# Patient Record
Sex: Female | Born: 1980
Health system: Southern US, Community
[De-identification: ages and names within clinical notes are randomized; demographics above are authoritative.]

## PROBLEM LIST (undated history)

## (undated) DIAGNOSIS — B019 Varicella without complication: Secondary | ICD-10-CM

## (undated) DIAGNOSIS — F32A Depression, unspecified: Secondary | ICD-10-CM

## (undated) DIAGNOSIS — F329 Major depressive disorder, single episode, unspecified: Secondary | ICD-10-CM

## (undated) HISTORY — DX: Varicella without complication: B01.9

## (undated) HISTORY — DX: Depression, unspecified: F32.A

## (undated) HISTORY — DX: Major depressive disorder, single episode, unspecified: F32.9

---

## 2005-07-17 HISTORY — PX: ANKLE ARTHROSCOPY WITH OPEN REDUCTION INTERNAL FIXATION (ORIF): SHX5582

## 2013-05-27 ENCOUNTER — Emergency Department: Payer: Self-pay | Admitting: Emergency Medicine

## 2013-05-27 LAB — CBC WITH DIFFERENTIAL/PLATELET
Basophil #: 0 10*3/uL (ref 0.0–0.1)
Basophil %: 0.6 %
Eosinophil #: 0.1 10*3/uL (ref 0.0–0.7)
HCT: 41 % (ref 35.0–47.0)
HGB: 14 g/dL (ref 12.0–16.0)
Lymphocyte #: 1.7 10*3/uL (ref 1.0–3.6)
Lymphocyte %: 24.3 %
MCH: 29.8 pg (ref 26.0–34.0)
MCV: 87 fL (ref 80–100)
Neutrophil #: 4.6 10*3/uL (ref 1.4–6.5)
Platelet: 327 10*3/uL (ref 150–440)
RDW: 13.4 % (ref 11.5–14.5)
WBC: 7 10*3/uL (ref 3.6–11.0)

## 2015-05-23 ENCOUNTER — Ambulatory Visit (INDEPENDENT_AMBULATORY_CARE_PROVIDER_SITE_OTHER): Payer: 59 | Admitting: Family Medicine

## 2015-05-23 ENCOUNTER — Encounter: Payer: Self-pay | Admitting: Family Medicine

## 2015-05-23 VITALS — BP 140/78 | HR 104 | Temp 98.7°F | Wt 269.0 lb

## 2015-05-23 DIAGNOSIS — J209 Acute bronchitis, unspecified: Secondary | ICD-10-CM

## 2015-05-23 MED ORDER — BENZONATATE 200 MG PO CAPS
200.0000 mg | ORAL_CAPSULE | Freq: Three times a day (TID) | ORAL | Status: DC | PRN
Start: 2015-05-23 — End: 2015-05-23

## 2015-05-23 MED ORDER — BENZONATATE 200 MG PO CAPS: 200.0000 mg | ORAL_CAPSULE | Freq: Three times a day (TID) | ORAL | Status: DC | PRN

## 2015-05-23 MED ORDER — AZITHROMYCIN 250 MG PO TABS
ORAL_TABLET | ORAL | Status: DC
Start: 2015-05-23 — End: 2015-07-04

## 2015-05-23 MED ORDER — AZITHROMYCIN 250 MG PO TABS: ORAL_TABLET | ORAL | Status: DC

## 2015-05-23 NOTE — Progress Notes (Signed)
Patient ID: Bridget Hull, female   DOB: [birth year], 34 y.o.   MRN: [id number]  Marikay Alar, MD Phone: [phone]  Bridget Hull is a 34 y.o. female who presents today for new patient visit.  Cough: Patient notes onset of upper respiratory symptoms including rhinorrhea, sore throat, and cough towards the end of September. She notes those improved initially, though the cough persisted. She has recently again developed similar symptoms of rhinorrhea and persistent cough. She notes some nasal congestion. She has no chest pain. She notes no persistent shortness of breath, though does have some increased work of breathing when exercising. No shortness of breath with walking around. She does have a history of asthma, though has not had any wheezing. She has a history of bronchitis in the past that ended up moving to her lungs and she developed pneumonia several years ago. She was treated with azithromycin and then Levaquin at that time. She has not noted any fevers recently. She does note she had some fevers at the beginning of October. No history of DVT or PE. She's not been on any long trips or had any surgeries. She has no cardiac history. She has no history of hypertension, hyperlipidemia, or diabetes. No family history of early cardiac disease.  Active Ambulatory Problems    Diagnosis Date Noted  . Acute bronchitis 05/23/2015   Resolved Ambulatory Problems    Diagnosis Date Noted  . No Resolved Ambulatory Problems   Past Medical History  Diagnosis Date  . Asthma   . Depression   . Chicken pox     Family History  Problem Relation Age of Onset  . Hypertension Mother   . Diabetes Father   . Hyperlipidemia Father   . Drug abuse Brother   . Stroke Maternal Grandmother   . Heart disease Maternal Grandmother   . Cancer Maternal Grandfather   . Cancer Paternal Grandmother   . Heart disease Paternal Grandfather   . Diabetes Paternal Grandfather   . Heart disease Father     Father  became septic and subsequently had a STEMI    Social History   Social History  . Marital Status: Single    Spouse Name: N/A  . Number of Children: N/A  . Years of Education: N/A   Occupational History  . Not on file.   Social History Main Topics  . Smoking status: Former Games developer  . Smokeless tobacco: Not on file  . Alcohol Use: No  . Drug Use: No  . Sexual Activity: Not on file   Other Topics Concern  . Not on file   Social History Narrative  . No narrative on file    ROS   General:  Negative for nexplained weight loss, fever Skin: Negative for new or changing mole, sore that won't heal HEENT: Negative for trouble hearing, trouble seeing, ringing in ears, mouth sores, hoarseness, change in voice, dysphagia. CV:  Negative for chest pain, dyspnea, edema, palpitations Resp: Positive for cough, Negative for dyspnea, hemoptysis GI: Negative for nausea, vomiting, diarrhea, constipation, abdominal pain, melena, hematochezia. GU: Negative for dysuria, incontinence, urinary hesitance, hematuria, vaginal or penile discharge, polyuria, sexual difficulty, lumps in testicle or breasts MSK: Negative for muscle cramps or aches, joint pain or swelling Neuro: Negative for headaches, weakness, numbness, dizziness, passing out/fainting Psych: Negative for depression, anxiety, memory problems  Objective  Physical Exam Filed Vitals:   05/23/15 0946  BP: 140/78  Pulse: 104  Temp: 98.7 F (37.1 C)    Physical Exam  Constitutional: She is well-developed, well-nourished, and in no distress.  HENT:  Head: Normocephalic and atraumatic.  Right Ear: External ear normal.  Left Ear: External ear normal.  Mild posterior oropharyngeal erythema, no exudates  Eyes: Conjunctivae are normal. Pupils are equal, round, and reactive to light.  Neck: Neck supple.  Cardiovascular: Normal rate, regular rhythm and normal heart sounds.  Exam reveals no gallop and no friction rub.   No murmur  heard. Pulmonary/Chest: Effort normal and breath sounds normal. No respiratory distress. She has no wheezes. She has no rales.  Abdominal: Soft. Bowel sounds are normal. She exhibits no distension. There is no tenderness. There is no rebound and no guarding.  Musculoskeletal: She exhibits no edema.  Lymphadenopathy:    She has no cervical adenopathy.  Neurological: She is alert. Gait normal.  Skin: Skin is warm and dry. She is not diaphoretic.  Psychiatric: Mood and affect normal.     Assessment/Plan:   Acute bronchitis Symptoms most consistent with bronchitis. She has a normal lung exam today. Her oxygenation is in the normal range. Discussed giving her a breathing treatment today in the office, though she declined this. Discussed that this could be reactive airway disease and that she could benefit from an inhaler, though she declined this as well. Unlikely to be related to pneumonia given normal oxygenation normal lung exam. Suspect pulse mildly elevated related to coughing and bronchitis. Unlikely to be related to PE or cardiac issue given history and lack of risk factors. We will treat her with azithromycin and Tessalon. She'll continue to monitor and if this worsens or she does not improve she will let us know. She's given return precautions.   Marikay AlarEric Sonnenberg

## 2015-05-23 NOTE — Assessment & Plan Note (Addendum)
Symptoms most consistent with bronchitis. She has a normal lung exam today. Her oxygenation is in the normal range. Discussed giving her a breathing treatment today in the office, though she declined this. Discussed that this could be reactive airway disease and that she could benefit from an inhaler, though she declined this as well. Unlikely to be related to pneumonia given normal oxygenation normal lung exam. Suspect pulse mildly elevated related to coughing and bronchitis. Unlikely to be related to PE or cardiac issue given history and lack of risk factors. We will treat her with azithromycin and Tessalon. She'll continue to monitor and if this worsens or she does not improve she will let us know. She's given return precautions.

## 2015-05-23 NOTE — Patient Instructions (Signed)
Nice to meet you. We're going to treat you for bronchitis with azithromycin. You can take Tessalon for her cough. Please stay well hydrated. If you develop chest pain, shortness of breath, palpitations, fever, chills, or feel worse, or any new or changing symptoms please seek medical attention.  Acute Bronchitis Bronchitis is when the airways that extend from the windpipe into the lungs get red, puffy, and painful (inflamed). Bronchitis often causes thick spit (mucus) to develop. This leads to a cough. A cough is the most common symptom of bronchitis. In acute bronchitis, the condition usually begins suddenly and goes away over time (usually in 2 weeks). Smoking, allergies, and asthma can make bronchitis worse. Repeated episodes of bronchitis may cause more lung problems. HOME CARE  Rest.  Drink enough fluids to keep your pee (urine) clear or pale yellow (unless you need to limit fluids as told by your doctor).  Only take over-the-counter or prescription medicines as told by your doctor.  Avoid smoking and secondhand smoke. These can make bronchitis worse. If you are a smoker, think about using nicotine gum or skin patches. Quitting smoking will help your lungs heal faster.  Reduce the chance of getting bronchitis again by:  Washing your hands often.  Avoiding people with cold symptoms.  Trying not to touch your hands to your mouth, nose, or eyes.  Follow up with your doctor as told. GET HELP IF: Your symptoms do not improve after 1 week of treatment. Symptoms include:  Cough.  Fever.  Coughing up thick spit.  Body aches.  Chest congestion.  Chills.  Shortness of breath.  Sore throat. GET HELP RIGHT AWAY IF:   You have an increased fever.  You have chills.  You have severe shortness of breath.  You have bloody thick spit (sputum).  You throw up (vomit) often.  You lose too much body fluid (dehydration).  You have a severe headache.  You faint. MAKE SURE  YOU:   Understand these instructions.  Will watch your condition.  Will get help right away if you are not doing well or get worse.   This information is not intended to replace advice given to you by your health care provider. Make sure you discuss any questions you have with your health care provider.   Document Released: 11/19/2007 Document Revised: 02/02/2013 Document Reviewed: 11/23/2012 Elsevier Interactive Patient Education [employer]2016 Elsevier Inc.

## 2015-06-26 ENCOUNTER — Encounter: Payer: 59 | Admitting: Family Medicine

## 2015-07-04 ENCOUNTER — Ambulatory Visit (INDEPENDENT_AMBULATORY_CARE_PROVIDER_SITE_OTHER): Payer: 59 | Admitting: Family Medicine

## 2015-07-04 ENCOUNTER — Encounter: Payer: Self-pay | Admitting: Family Medicine

## 2015-07-04 VITALS — BP 128/82 | HR 75 | Temp 98.4°F | Ht 70.0 in | Wt 268.8 lb

## 2015-07-04 DIAGNOSIS — Z0000 Encounter for general adult medical examination without abnormal findings: Secondary | ICD-10-CM

## 2015-07-04 DIAGNOSIS — Z0001 Encounter for general adult medical examination with abnormal findings: Secondary | ICD-10-CM | POA: Insufficient documentation

## 2015-07-04 NOTE — Progress Notes (Signed)
Patient ID: Bridget Hull, female   DOB: Jul 29, 1980, 35 y.o.   MRN: [id number]  Marikay Alar, MD Phone: [phone]  Bridget Hull is a 35 y.o. female who presents today for physical exam.  Patient reports overall she is doing well. Last Pap smear was in 2014 and she reports it was normal. Has never had an abnormal Pap smear. She does note she does not have regular periods. She thinks she may have PCO last period started menstruating at age 1. Notes oftentimes she can go 6 months without a period. Last menstrual period was last week. Does not do breast self exams. Had negative HIV in 2012. Had negative hepatitis C and thousand 12. Does not smoke cigarettes. Rare alcohol use. No illicit drug use. Flu shot in October 2016. Tdap in August 2013. No history of domestic violence. Exercises by walking 3 times a week for 1-2 hours. Notes diet is not good. Typically eats [employer] Friday through Sunday she works night shift. She does eat 3 meals a day. Does eat lots of cheese. Does not eat a lot of meat. She grew up as a vegetarian, but is not a lot of vegetables.  Active Ambulatory Problems    Diagnosis Date Noted  . Acute bronchitis 05/23/2015  . Annual physical exam 07/04/2015   Resolved Ambulatory Problems    Diagnosis Date Noted  . No Resolved Ambulatory Problems   Past Medical History  Diagnosis Date  . Asthma   . Depression   . Chicken pox     Family History  Problem Relation Age of Onset  . Hypertension Mother   . Diabetes Father   . Hyperlipidemia Father   . Drug abuse Brother   . Stroke Maternal Grandmother   . Heart disease Maternal Grandmother   . Cancer Maternal Grandfather   . Cancer Paternal Grandmother   . Heart disease Paternal Grandfather   . Diabetes Paternal Grandfather   . Heart disease Father     Father became septic and subsequently had a STEMI    Social History   Social History  . Marital Status: Single    Spouse Name: N/A  . Number of  Children: N/A  . Years of Education: N/A   Occupational History  . Not on file.   Social History Main Topics  . Smoking status: Former Games developer  . Smokeless tobacco: Not on file  . Alcohol Use: No  . Drug Use: No  . Sexual Activity: Not on file   Other Topics Concern  . Not on file   Social History Narrative  . No narrative on file    ROS   General:  Negative for nexplained weight loss, fever Skin: Negative for new or changing mole, sore that won't heal HEENT: Negative for trouble hearing, trouble seeing, ringing in ears, mouth sores, hoarseness, change in voice, dysphagia. CV:  Negative for chest pain, dyspnea, edema, palpitations Resp: Negative for cough, dyspnea, hemoptysis GI: Negative for nausea, vomiting, diarrhea, constipation, abdominal pain, melena, hematochezia. GU: Negative for dysuria, incontinence, urinary hesitance, hematuria, vaginal or penile discharge, polyuria, sexual difficulty, lumps in testicle or breasts MSK: Negative for muscle cramps or aches, joint pain or swelling Neuro: Negative for headaches, weakness, numbness, dizziness, passing out/fainting Psych: Negative for depression, anxiety, memory problems  Objective  Physical Exam Filed Vitals:   07/04/15 0835  BP: 128/82  Pulse: 75  Temp: 98.4 F (36.9 C)    BP Readings from Last 3 Encounters:  07/04/15 128/82  05/23/15 140/78  Wt Readings from Last 3 Encounters:  07/04/15 268 lb 12.8 oz (121.927 kg)  05/23/15 269 lb (122.018 kg)    Physical Exam  Constitutional: She is well-developed, well-nourished, and in no distress.  HENT:  Head: Normocephalic and atraumatic.  Right Ear: External ear normal.  Left Ear: External ear normal.  Mouth/Throat: Oropharynx is clear and moist. No oropharyngeal exudate.  Eyes: Conjunctivae are normal. Pupils are equal, round, and reactive to light.  Neck: Neck supple.  Cardiovascular: Normal rate, regular rhythm and normal heart sounds.  Exam reveals no  gallop and no friction rub.   No murmur heard. Pulmonary/Chest: Effort normal and breath sounds normal. No respiratory distress. She has no wheezes. She has no rales.  Abdominal: Soft. Bowel sounds are normal. She exhibits no distension. There is no tenderness. There is no rebound and no guarding.  Genitourinary:  Declines pelvic exam and Pap smear today  Musculoskeletal: She exhibits no edema.  Lymphadenopathy:    She has no cervical adenopathy.  Neurological: She is alert. Gait normal.  Skin: Skin is warm and dry. She is not diaphoretic.  Psychiatric: Mood and affect normal.     Assessment/Plan:   Annual physical exam Overall patient is doing well. Blood pressure is in the normal range. She is obese. We discussed diet and exercise at length and she is given dietary information. She has had an HIV test in the past. She does not smoke. Rarely drinks alcohol. No illicits. Already had flu shot. Is up-to-date on tetanus shot. Pap smear 2 years ago and reports that she will go back to her gynecologist for her next Pap smear later this year. Discussed that she should also follow-up with them for potential PCOS given her irregular periods and body habitus. She is given a handwritten prescription for lab work including lipid panel, CBC, CMP, hepatitis C, TSH, and A1c.    Marikay Alar

## 2015-07-04 NOTE — Patient Instructions (Signed)
Nice to see you. Please follow up with your gynecologist for a Pap smear and discussion of your possible PCOS. Please work on your diet and exercise as we discussed. There is diet information listed below. Check lab work.  Diet Recommendations  Starchy (carb) foods: Bread, rice, pasta, potatoes, corn, cereal, grits, crackers, bagels, muffins, all baked goods.  (Fruits, milk, and yogurt also have carbohydrate, but most of these foods will not spike your blood sugar as the starchy foods will.)  A few fruits do cause high blood sugars; use small portions of bananas (limit to 1/2 at a time), grapes, watermelon, oranges, and most tropical fruits.    Protein foods: Meat, fish, poultry, eggs, dairy foods, and beans such as pinto and kidney beans (beans also provide carbohydrate).   1. Eat at least 3 meals and 1-2 snacks per day. Never go more than 4-5 hours while awake without eating. Eat breakfast within the first hour of getting up.   2. Limit starchy foods to TWO per meal and ONE per snack. ONE portion of a starchy  food is equal to the following:   - ONE slice of bread (or its equivalent, such as half of a hamburger bun).   - 1/2 cup of a "scoopable" starchy food such as potatoes or rice.   - 15 grams of carbohydrate as shown on food label.  3. Include at every meal: a protein food, a carb food, and vegetables and/or fruit.   - Obtain twice the volume of veg's as protein or carbohydrate foods for both lunch and dinner.   - Fresh or frozen veg's are best.   - Keep frozen veg's on hand for a quick vegetable serving.

## 2015-07-04 NOTE — Assessment & Plan Note (Signed)
Overall patient is doing well. Blood pressure is in the normal range. She is obese. We discussed diet and exercise at length and she is given dietary information. She has had an HIV test in the past. She does not smoke. Rarely drinks alcohol. No illicits. Already had flu shot. Is up-to-date on tetanus shot. Pap smear 2 years ago and reports that she will go back to her gynecologist for her next Pap smear later this year. Discussed that she should also follow-up with them for potential PCOS given her irregular periods and body habitus. She is given a handwritten prescription for lab work including lipid panel, CBC, CMP, hepatitis C, TSH, and A1c.

## 2015-07-04 NOTE — Progress Notes (Signed)
Pre visit review using our clinic review tool, if applicable. No additional management support is needed unless otherwise documented below in the visit note. 

## 2015-10-02 ENCOUNTER — Ambulatory Visit: Payer: 59 | Admitting: Family Medicine

## 2015-10-30 ENCOUNTER — Ambulatory Visit (INDEPENDENT_AMBULATORY_CARE_PROVIDER_SITE_OTHER): Payer: 59 | Admitting: Family Medicine

## 2015-10-30 ENCOUNTER — Encounter: Payer: Self-pay | Admitting: Family Medicine

## 2015-10-30 VITALS — BP 132/94 | HR 87 | Temp 98.3°F | Ht 70.0 in | Wt 269.8 lb

## 2015-10-30 DIAGNOSIS — F32A Depression, unspecified: Secondary | ICD-10-CM | POA: Insufficient documentation

## 2015-10-30 DIAGNOSIS — F329 Major depressive disorder, single episode, unspecified: Secondary | ICD-10-CM

## 2015-10-30 MED ORDER — BUPROPION HCL ER (XL) 150 MG PO TB24: 150.0000 mg | ORAL_TABLET | Freq: Every day | ORAL | Status: DC

## 2015-10-30 NOTE — Progress Notes (Signed)
Pre visit review using our clinic review tool, if applicable. No additional management support is needed unless otherwise documented below in the visit note. 

## 2015-10-30 NOTE — Patient Instructions (Signed)
Nice to see you. We will start you on Wellbutrin. If you develop worsening depression, or if you develop anxiety, thoughts of harming yourself or others, or any new or changing symptoms please seek medical attention.

## 2015-10-30 NOTE — Progress Notes (Signed)
Patient ID: Bridget Hull, female   DOB: 08/08/1980, 35 y.o.   MRN: [id number]  Bridget AlarEric Sonnenberg, MD Phone: [phone]  Bridget Hull is a 35 y.o. female who presents today for same-day visit.  Depression: Patient notes significant depression at this time. Just comes out of nowhere. She has had this in the past and typically it occurs every 6-8 years. She had an episode of depression in college where she attempted to commit suicide by overdosing on Elavil. She was hospitalized for 4 weeks at that time. She had been followed by psychiatrist following that. She notes currently feeling depressed and having little interest in things. Difficulty sleeping. Having little energy. Poor appetite. Trouble concentrating. She denies any thoughts of harming herself or others. She just unsure why this occurs. She's previously been on Wellbutrin with good benefit. Prior to that they tried her on Prozac, lithium, and another SSRI all of which were not that beneficial.  PMH: Former smoker ROS see history of present illness  Objective  Physical Exam Filed Vitals:   10/30/15 0800  BP: 132/94  Pulse: 87  Temp: 98.3 F (36.8 C)    BP Readings from Last 3 Encounters:  10/30/15 132/94  07/04/15 128/82  05/23/15 140/78   Wt Readings from Last 3 Encounters:  10/30/15 269 lb 12.8 oz (122.38 kg)  07/04/15 268 lb 12.8 oz (121.927 kg)  05/23/15 269 lb (122.018 kg)    Physical Exam  Constitutional: She is well-developed, well-nourished, and in no distress.  HENT:  Head: Normocephalic and atraumatic.  Cardiovascular: Normal rate, regular rhythm and normal heart sounds.   Pulmonary/Chest: Effort normal and breath sounds normal.  Neurological: She is alert. Gait normal.  Skin: Skin is warm and dry. She is not diaphoretic.  Psychiatric:  Mood depressed, affect is depressed and tearful     Assessment/Plan: Please see individual problem list.  Depression Patient with significant depression. Denies SI  and HI. Has a fairly significant past psychiatric history with depression and suicide attempt requiring hospitalization. She notes she does not want to get to that point this time and wants to catch this early. We will start her on Wellbutrin as she has responded well to this in the past. She will continue melatonin for sleep. I'll see her back in 2-3 weeks for follow-up. Did discuss that it could take 4-8 weeks for the medication to take effect. I offered counseling and therapy though she declined this. Suspect blood pressure slightly elevated related to her current mental state. We will continue to monitor this. She's given return precautions.    No orders of the defined types were placed in this encounter.    Meds ordered this encounter  Medications  . buPROPion (WELLBUTRIN XL) 150 MG 24 hr tablet    Sig: Take 1 tablet (150 mg total) by mouth daily.    Dispense:  30 tablet    Refill:  3    Bridget AlarEric Sonnenberg, MD [facility] [place] [place]

## 2015-10-30 NOTE — Assessment & Plan Note (Signed)
Patient with significant depression. Denies SI and HI. Has a fairly significant past psychiatric history with depression and suicide attempt requiring hospitalization. She notes she does not want to get to that point this time and wants to catch this early. We will start her on Wellbutrin as she has responded well to this in the past. She will continue melatonin for sleep. I'll see her back in 2-3 weeks for follow-up. Did discuss that it could take 4-8 weeks for the medication to take effect. I offered counseling and therapy though she declined this. Suspect blood pressure slightly elevated related to her current mental state. We will continue to monitor this. She's given return precautions.

## 2015-11-13 ENCOUNTER — Encounter: Payer: Self-pay | Admitting: Family Medicine

## 2015-11-13 ENCOUNTER — Ambulatory Visit (INDEPENDENT_AMBULATORY_CARE_PROVIDER_SITE_OTHER): Payer: 59 | Admitting: Family Medicine

## 2015-11-13 VITALS — BP 130/86 | HR 78 | Temp 98.8°F | Ht 70.0 in | Wt 270.2 lb

## 2015-11-13 DIAGNOSIS — F329 Major depressive disorder, single episode, unspecified: Secondary | ICD-10-CM | POA: Diagnosis not present

## 2015-11-13 DIAGNOSIS — F32A Depression, unspecified: Secondary | ICD-10-CM

## 2015-11-13 NOTE — Progress Notes (Signed)
Pre visit review using our clinic review tool, if applicable. No additional management support is needed unless otherwise documented below in the visit note. 

## 2015-11-13 NOTE — Patient Instructions (Signed)
Nice to see you. I'm glad you're improving. Please continue the Wellbutrin. You could consider getting an artificial sunlamp in using this for 30 minutes to 2 hours daily during what you consider your morning time. If you develop worsening depression, or develop thoughts of harming herself or others, or any new or changing symptoms please seek medical attention.

## 2015-11-13 NOTE — Progress Notes (Signed)
Patient ID: Bridget Hull, female   DOB: 01/05/1981, 35 y.o.   MRN: [id number]  Marikay AlarEric Madalyne Husk, MD Phone: [phone]  Bridget Hull is a 35 y.o. female who presents today for follow-up.  Depression: Patient notes this is better somewhat since being on the Wellbutrin. She is sleeping better. Getting about 4 hours for sleep instead of 2. No SI or HI. Notes work is okay. See below for symptomatology.  Depression screen PHQ 2/9 11/13/2015  Decreased Interest 2  Down, Depressed, Hopeless 2  PHQ - 2 Score 4  Altered sleeping 2  Tired, decreased energy 2  Change in appetite 0  Feeling bad or failure about yourself  0  Trouble concentrating 2  Moving slowly or fidgety/restless 0  Suicidal thoughts 0  PHQ-9 Score 10  Difficult doing work/chores Somewhat difficult   GAD 7 : Generalized Anxiety Score 11/13/2015  Nervous, Anxious, on Edge 1  Control/stop worrying 0  Worry too much - different things 0  Trouble relaxing 2  Restless 0  Easily annoyed or irritable 2  Afraid - awful might happen 0  Total GAD 7 Score 5  Anxiety Difficulty Somewhat difficult    PMH: Former smoker.   ROS see history of present illness  Objective  Physical Exam Filed Vitals:   11/13/15 0957  BP: 130/86  Pulse: 78  Temp: 98.8 F (37.1 C)    BP Readings from Last 3 Encounters:  11/13/15 130/86  10/30/15 132/94  07/04/15 128/82   Wt Readings from Last 3 Encounters:  11/13/15 270 lb 3.2 oz (122.562 kg)  10/30/15 269 lb 12.8 oz (122.38 kg)  07/04/15 268 lb 12.8 oz (121.927 kg)    Physical Exam  Constitutional: She is well-developed, well-nourished, and in no distress.  HENT:  Head: Normocephalic and atraumatic.  Cardiovascular: Normal rate, regular rhythm and normal heart sounds.   Pulmonary/Chest: Effort normal and breath sounds normal.  Skin: Skin is warm and dry. She is not diaphoretic.  Psychiatric:  Mood depressed, affect depressed and flat     Assessment/Plan: Please see  individual problem list.  Depression Somewhat improved on Wellbutrin. Able to get more sleep. No SI or HI. Discussed potentially increasing Wellbutrin to 300 mg daily, though patient was hesitant and given her already having some improvement we will continue with 150 mg daily. Discussed obtaining a sun lamp after she asked about this. 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 She's given return precautions.   Marikay AlarEric Judit Awad, MD [facility] [place]

## 2015-11-13 NOTE — Assessment & Plan Note (Signed)
Somewhat improved on Wellbutrin. Able to get more sleep. No SI or HI. Discussed potentially increasing Wellbutrin to 300 mg daily, though patient was hesitant and given her already having some improvement we will continue with 150 mg daily. Discussed obtaining a sun lamp after she asked about this. 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 She's given return precautions.

## 2015-12-03 ENCOUNTER — Other Ambulatory Visit: Payer: Self-pay

## 2015-12-03 MED ORDER — BUPROPION HCL ER (XL) 150 MG PO TB24: 150.0000 mg | ORAL_TABLET | Freq: Every day | ORAL | Status: DC

## 2015-12-19 ENCOUNTER — Ambulatory Visit (INDEPENDENT_AMBULATORY_CARE_PROVIDER_SITE_OTHER): Payer: 59 | Admitting: Family Medicine

## 2015-12-19 ENCOUNTER — Encounter: Payer: Self-pay | Admitting: Family Medicine

## 2015-12-19 VITALS — BP 126/84 | HR 72 | Temp 98.3°F | Ht 70.0 in | Wt 269.8 lb

## 2015-12-19 DIAGNOSIS — F329 Major depressive disorder, single episode, unspecified: Secondary | ICD-10-CM

## 2015-12-19 DIAGNOSIS — F32A Depression, unspecified: Secondary | ICD-10-CM

## 2015-12-19 MED ORDER — BUPROPION HCL ER (XL) 300 MG PO TB24: 300.0000 mg | ORAL_TABLET | Freq: Every day | ORAL | Status: DC

## 2015-12-19 NOTE — Assessment & Plan Note (Signed)
No improvement. No thoughts of harming herself or others. Discussed options for further treatment. Patient opted to increase Wellbutrin to 300 mg daily. She'll take 2 tablets of her current prescription until gone and then will start on 300 mg tablets. She will continue to monitor. She's given return precautions. Follow-up in 6 weeks.

## 2015-12-19 NOTE — Progress Notes (Signed)
Pre visit review using our clinic review tool, if applicable. No additional management support is needed unless otherwise documented below in the visit note. 

## 2015-12-19 NOTE — Patient Instructions (Signed)
Nice to see you. Please increase your Wellbutrin to 300 mg daily. You can take 2 tablets until you run out of your current prescription. We have about 1-2 weeks left please call us to request a refill. If you develop thoughts of harming your self or others or you have worsening depression please seek medical attention.

## 2015-12-19 NOTE — Progress Notes (Signed)
Patient ID: Bridget Hull, female   DOB: 03/12/1981, 35 y.o.   MRN: [id number]  Bridget AlarEric Zaynab Chipman, MD Phone: [phone]  Bridget Hull is a 35 y.o. female who presents today for follow-up.  Depression: Patient notes her depression is stable. Sleep has been about the same. Still some irritability while at work. Tried the sunlamp though this wasn't very beneficial. Is taking Wellbutrin 150 mg daily though has not noticed a difference since her last visit. No thoughts of harming herself. She expresses concern about not wanting to end up like her mother or her sister who have both been institutionalized related to psychological issues. Depression screen [facility] 12/19/2015 11/13/2015  Decreased Interest 2 2  Down, Depressed, Hopeless 2 2  PHQ - 2 Score 4 4  Altered sleeping 2 2  Tired, decreased energy 2 2  Change in appetite 2 0  Feeling bad or failure about yourself  1 0  Trouble concentrating 2 2  Moving slowly or fidgety/restless 0 0  Suicidal thoughts 0 0  PHQ-9 Score 13 10  Difficult doing work/chores Somewhat difficult Somewhat difficult    ROS see history of present illness  Objective  Physical Exam Filed Vitals:   12/19/15 0956  BP: 126/84  Pulse: 72  Temp: 98.3 F (36.8 C)    BP Readings from Last 3 Encounters:  12/19/15 126/84  11/13/15 130/86  10/30/15 132/94   Wt Readings from Last 3 Encounters:  12/19/15 269 lb 12.8 oz (122.38 kg)  11/13/15 270 lb 3.2 oz (122.562 kg)  10/30/15 269 lb 12.8 oz (122.38 kg)    Physical Exam  Constitutional: No distress.  Cardiovascular: Normal rate, regular rhythm and normal heart sounds.   Pulmonary/Chest: Effort normal and breath sounds normal.  Skin: She is not diaphoretic.  Psychiatric:  Mood depressed, affect flat intermittently tearful     Assessment/Plan: Please see individual problem list.  Depression No improvement. No thoughts of harming herself or others. Discussed options for further treatment. Patient opted to  increase Wellbutrin to 300 mg daily. She'll take 2 tablets of her current prescription until gone and then will start on 300 mg tablets. She will continue to monitor. She's given return precautions. Follow-up in 6 weeks.    No orders of the defined types were placed in this encounter.    Meds ordered this encounter  Medications  . buPROPion (WELLBUTRIN XL) 300 MG 24 hr tablet    Sig: Take 1 tablet (300 mg total) by mouth daily.    Dispense:  90 tablet    Refill:  1    Bridget AlarEric Arita Severtson, MD [facility]

## 2016-01-29 ENCOUNTER — Telehealth: Payer: Self-pay | Admitting: Family Medicine

## 2016-01-29 ENCOUNTER — Other Ambulatory Visit: Payer: Self-pay | Admitting: Family Medicine

## 2016-01-29 ENCOUNTER — Ambulatory Visit: Payer: 59 | Admitting: Family Medicine

## 2016-01-29 DIAGNOSIS — Z0289 Encounter for other administrative examinations: Secondary | ICD-10-CM

## 2016-01-29 MED ORDER — BUPROPION HCL ER (XL) 300 MG PO TB24: 300.0000 mg | ORAL_TABLET | Freq: Every day | ORAL | 1 refills | Status: DC

## 2016-01-29 NOTE — Telephone Encounter (Signed)
Please advise, she was suppose to see you this am cancelled appt.

## 2016-01-29 NOTE — Telephone Encounter (Signed)
FYI, Pt missed appt this morning. appt was resch. Let me know if you want me to cancel appt. Thank you!

## 2016-01-29 NOTE — Telephone Encounter (Signed)
Refill given. Patient should reschedule for follow-up at her convenience.

## 2016-01-29 NOTE — Telephone Encounter (Signed)
Pt called needing a refill for buPROPion (WELLBUTRIN XL) 300 MG 24 hr tablet.  [facility] [place] - [id number]  Call pt @ [phone]. Thank you!

## 2016-04-01 ENCOUNTER — Encounter: Payer: Self-pay | Admitting: Family Medicine

## 2016-04-01 ENCOUNTER — Encounter (INDEPENDENT_AMBULATORY_CARE_PROVIDER_SITE_OTHER): Payer: Self-pay

## 2016-04-01 ENCOUNTER — Ambulatory Visit (INDEPENDENT_AMBULATORY_CARE_PROVIDER_SITE_OTHER): Payer: 59 | Admitting: Family Medicine

## 2016-04-01 VITALS — BP 114/82 | HR 95 | Temp 99.0°F | Wt 275.1 lb

## 2016-04-01 DIAGNOSIS — F3341 Major depressive disorder, recurrent, in partial remission: Secondary | ICD-10-CM

## 2016-04-01 DIAGNOSIS — R1011 Right upper quadrant pain: Secondary | ICD-10-CM

## 2016-04-01 DIAGNOSIS — K760 Fatty (change of) liver, not elsewhere classified: Secondary | ICD-10-CM | POA: Insufficient documentation

## 2016-04-01 LAB — COMPREHENSIVE METABOLIC PANEL
ALT: 19 U/L (ref 0–35)
AST: 18 U/L (ref 0–37)
Albumin: 4.5 g/dL (ref 3.5–5.2)
Alkaline Phosphatase: 57 U/L (ref 39–117)
BUN: 12 mg/dL (ref 6–23)
CALCIUM: 10 mg/dL (ref 8.4–10.5)
CHLORIDE: 104 meq/L (ref 96–112)
CO2: 29 meq/L (ref 19–32)
CREATININE: 0.78 mg/dL (ref 0.40–1.20)
GFR: 88.08 mL/min (ref >60.00)
Glucose, Bld: 106 mg/dL — ABNORMAL HIGH (ref 70–99)
Potassium: 4.4 mEq/L (ref 3.5–5.1)
Sodium: 139 mEq/L (ref 135–145)
Total Bilirubin: 0.4 mg/dL (ref 0.2–1.2)
Total Protein: 7.1 g/dL (ref 6.0–8.3)

## 2016-04-01 LAB — LIPASE: Lipase: 45 U/L (ref 11.0–59.0)

## 2016-04-01 LAB — POCT URINE PREGNANCY: Preg Test, Ur: NEGATIVE

## 2016-04-01 NOTE — Patient Instructions (Signed)
Nice to see you. Please continue Wellbutrin. We are going to obtain lab work to evaluate your right upper quadrant pain. We will call with results and determine the next step in management after that. If you have worsening pain, or develop nausea, vomiting, diarrhea, fevers, or any new or changing symptoms please seek medical attention.

## 2016-04-01 NOTE — Progress Notes (Signed)
  Tommi Rumps, MD Phone: [phone]  Bridget Hull is a 35 y.o. female who presents today for follow-up.  Depression is significantly better. Reports she is doing well this time. 90% better. Not as irritable. Notes her significant other and family members have been able to tell a difference. No SI. She's continuing on Wellbutrin.  Patient additionally notes right upper quadrant soreness for the last 1.5 months. She even tried switching to a low-fat diet with no improvement. No nausea, vomiting, or fevers. Occasionally she is able to press and it hurts in the right upper quadrant. LMP was a month ago. She is due to start her period this week. Notes the discomfort is constantly present. Notes she's had similar symptoms in the past and had a extensive workup with ultrasound and a HIDA scan which were negative. Also had an EGD that revealed a duodenal ulcer. She reports she checked her CBC with white blood cell count of 8.9 this past Sunday.  ROS see history of present illness  Objective  Physical Exam Vitals:   04/01/16 0914  BP: 114/82  Pulse: 95  Temp: 99 F (37.2 C)    BP Readings from Last 3 Encounters:  04/01/16 114/82  12/19/15 126/84  11/13/15 130/86   Wt Readings from Last 3 Encounters:  04/01/16 275 lb 2 oz (124.8 kg)  12/19/15 269 lb 12.8 oz (122.4 kg)  11/13/15 270 lb 3.2 oz (122.6 kg)    Physical Exam  Constitutional: She is well-developed, well-nourished, and in no distress.  Cardiovascular: Normal rate, regular rhythm and normal heart sounds.   Pulmonary/Chest: Effort normal and breath sounds normal.  Abdominal: Soft. Bowel sounds are normal. She exhibits no distension. There is tenderness (right upper quadrant tenderness). There is no rebound and no guarding.  Neurological: She is alert. Gait normal.  Skin: Skin is warm and dry.  Psychiatric: Mood and affect normal.     Assessment/Plan: Please see individual problem list.  Depression Significantly  improved. She'll continue Wellbutrin at this time.  Abdominal pain, right upper quadrant Right upper quadrant pain over the last month and half. Some mild tenderness on exam. No peritoneal signs. Vital signs are stable. Urine pregnancy test negative. She declines repeat CBC. Will check a CMP and lipase. If negative would consider ultrasound. Given return precautions.   Orders Placed This Encounter  Procedures  . Comp Met (CMET)  . Lipase  . POCT urine pregnancy    Tommi Rumps, MD [place]

## 2016-04-01 NOTE — Assessment & Plan Note (Signed)
Significantly improved. She'll continue Wellbutrin at this time.

## 2016-04-01 NOTE — Assessment & Plan Note (Signed)
Right upper quadrant pain over the last month and half. Some mild tenderness on exam. No peritoneal signs. Vital signs are stable. Urine pregnancy test negative. She declines repeat CBC. Will check a CMP and lipase. If negative would consider ultrasound. Given return precautions.

## 2016-04-03 ENCOUNTER — Telehealth: Payer: Self-pay | Admitting: Family Medicine

## 2016-04-03 NOTE — Telephone Encounter (Signed)
Spoke with patient and advised of results.

## 2016-04-03 NOTE — Telephone Encounter (Signed)
Tried calling patient back voicemail box full unable to leave message

## 2016-04-03 NOTE — Telephone Encounter (Signed)
Pt called back requesting results. Pt stated that she is having some issues with her phone.

## 2016-04-03 NOTE — Telephone Encounter (Signed)
Pt called requesting lab results. Thank you!  Call pt @ [phone]

## 2016-07-08 ENCOUNTER — Ambulatory Visit (INDEPENDENT_AMBULATORY_CARE_PROVIDER_SITE_OTHER): Payer: 59 | Admitting: Family Medicine

## 2016-07-08 ENCOUNTER — Encounter: Payer: Self-pay | Admitting: Family Medicine

## 2016-07-08 DIAGNOSIS — F3341 Major depressive disorder, recurrent, in partial remission: Secondary | ICD-10-CM

## 2016-07-08 DIAGNOSIS — R0781 Pleurodynia: Secondary | ICD-10-CM | POA: Diagnosis not present

## 2016-07-08 MED ORDER — BUPROPION HCL ER (XL) 300 MG PO TB24: 300.0000 mg | ORAL_TABLET | Freq: Every day | ORAL | 3 refills | Status: DC

## 2016-07-08 NOTE — Assessment & Plan Note (Signed)
Exam and symptoms now most consistent with right rib discomfort and pain. She noted no injury. She is tender over her right lower ribs. Benign abdominal exam. Suspect intercostal muscular strain as cause of symptoms. Offered x-ray to rule out rib fracture as she has had that in the past. She declined this. Offered right upper quadrant ultrasound though we both felt this would be low yield given that she does not have abdominal pain and she did have a brief ultrasound at work that she reports was normal. Lab work previously was normal as well. Discussed doing a trial of scheduled NSAIDs though she declined this as well. Advised on heat. She wanted to continue to monitor this and not proceed with any further evaluation or management at this time. If worsens or changes she'll let us know.

## 2016-07-08 NOTE — Assessment & Plan Note (Signed)
Much improved. We'll continue Wellbutrin for another 6 months at least and consider tapering off at patient request at that time. She'll continue to monitor.

## 2016-07-08 NOTE — Progress Notes (Signed)
Pre visit review using our clinic review tool, if applicable. No additional management support is needed unless otherwise documented below in the visit note. 

## 2016-07-08 NOTE — Patient Instructions (Signed)
Nice to see you. We are going to refill your Wellbutrin. Please monitor your right rib pain and if this worsens or changes in any manner or you develop new symptoms he should be reevaluated.

## 2016-07-08 NOTE — Progress Notes (Signed)
Marikay AlarEric Gianmarco Roye, MD Phone: [phone]  Bridget Hull is a 36 y.o. female who presents today for follow-up.  Depression: Patient notes this is quite a bit better. Very minimal if any depression. No anxiety. Taking Wellbutrin daily. No SI or HI.  Patient notes she is continued to have the discomfort in her right side. It is actually slightly worse. Bothers her if she rolls over in bed. Now it is more so in her right lower ribs, right lateral ribs, and right posterior ribs, does not radiate anywhere. No abdominal pain with this. No nausea or vomiting. No change in bowel movements. No urinary issues. LMP was around [place]. Patient notes she did have an EGD and ultrasound and HIDA scan in the distant past for right upper quadrant discomfort and nausea that revealed gastric outlet irritation and slight stenosis though no other issues. Prior lab work was reassuring. 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 Patient was nothing by mouth for that brief ultrasound. They noted that they did not see anything abnormal with the gallbladder or liver. She tried Motrin one day with some benefit.  PMH: Former smoker   ROS see history of present illness  Objective  Physical Exam Vitals:   07/08/16 0912  BP: 118/80  Pulse: 78  Temp: 98.6 F (37 C)    BP Readings from Last 3 Encounters:  07/08/16 118/80  04/01/16 114/82  12/19/15 126/84   Wt Readings from Last 3 Encounters:  07/08/16 273 lb 3.2 oz (123.9 kg)  04/01/16 275 lb 2 oz (124.8 kg)  12/19/15 269 lb 12.8 oz (122.4 kg)    Physical Exam  Constitutional: No distress.  Cardiovascular: Normal rate, regular rhythm and normal heart sounds.   Pulmonary/Chest: Effort normal and breath sounds normal.  Abdominal: Soft. Bowel sounds are normal. She exhibits no distension. There is no tenderness.  Musculoskeletal:  Patient with mild tenderness over right anterior, right lateral, and  right posterior lower ribs, no defects palpated or noted on visualization, no overlying changes noted  Neurological: She is alert. Gait normal.  Skin: Skin is warm and dry. She is not diaphoretic.  Psychiatric: Mood and affect normal.     Assessment/Plan: Please see individual problem list.  Depression Much improved. We'll continue Wellbutrin for another 6 months at least and consider tapering off at patient request at that time. She'll continue to monitor.  Rib pain on right side Exam and symptoms now most consistent with right rib discomfort and pain. She noted no injury. She is tender over her right lower ribs. Benign abdominal exam. Suspect intercostal muscular strain as cause of symptoms. Offered x-ray to rule out rib fracture as she has had that in the past. She declined this. Offered right upper quadrant ultrasound though we both felt this would be low yield given that she does not have abdominal pain and she did have a brief ultrasound at work that she reports was normal. Lab work previously was normal as well. Discussed doing a trial of scheduled NSAIDs though she declined this as well. Advised on heat. She wanted to continue to monitor this and not proceed with any further evaluation or management at this time. If worsens or changes she'll let us know.   No orders of the defined types were placed in this encounter.   Meds ordered this encounter  Medications  . buPROPion (WELLBUTRIN XL) 300 MG 24 hr tablet    Sig: Take 1 tablet (  300 mg total) by mouth daily.    Dispense:  90 tablet    Refill:  3    Marikay Alar, MD [facility]

## 2017-01-06 ENCOUNTER — Encounter: Payer: Self-pay | Admitting: Family Medicine

## 2017-01-06 ENCOUNTER — Ambulatory Visit (INDEPENDENT_AMBULATORY_CARE_PROVIDER_SITE_OTHER): Payer: 59 | Admitting: Family Medicine

## 2017-01-06 VITALS — BP 138/86 | HR 86 | Temp 99.6°F | Wt 277.6 lb

## 2017-01-06 DIAGNOSIS — F3342 Major depressive disorder, recurrent, in full remission: Secondary | ICD-10-CM

## 2017-01-06 DIAGNOSIS — R0781 Pleurodynia: Secondary | ICD-10-CM | POA: Diagnosis not present

## 2017-01-06 DIAGNOSIS — R1011 Right upper quadrant pain: Secondary | ICD-10-CM | POA: Diagnosis not present

## 2017-01-06 NOTE — Patient Instructions (Signed)
Nice to see you. Monitor your depression and if this returns please let us know. We will obtain an ultrasound to evaluate your right upper quadrant versus right rib pain. If this worsens please be evaluated.

## 2017-01-06 NOTE — Progress Notes (Signed)
  Bridget AlarEric Leiana Rund, MD Phone: [phone]  Bridget Hull is a 36 y.o. female who presents today for follow-up.  Depression: Notes no depression. No anxiety. She stopped her Wellbutrin on her own 3-4 months ago when she ran out. Has not had any worsening of symptoms. No SI or HI.  Right upper quadrant versus right lower rib pain has been persistent. It is always there as a slight ache. No radiation. She underwent evaluation several years ago for this with a HIDA scan and ultrasound that were normal. She had an informal ultrasound that she reports was normal recently. Lab work has been unremarkable. Tenderness is typically over her right lower ribs. Occasionally it will worsen when she doesn't eat fatty food for several days and then has a fatty meal. Also worsens with twisting a or re laying on the area. She does have a history of duodenal ulcer though this feels different than that. No nausea, vomiting, diarrhea, or urinary symptoms. She is due for her menstrual cycle tomorrow.   ROS see history of present illness  Objective  Physical Exam Vitals:   01/06/17 1000  BP: 138/86  Pulse: 86  Temp: 99.6 F (37.6 C)    BP Readings from Last 3 Encounters:  01/06/17 138/86  07/08/16 118/80  04/01/16 114/82   Wt Readings from Last 3 Encounters:  01/06/17 277 lb 9.6 oz (125.9 kg)  07/08/16 273 lb 3.2 oz (123.9 kg)  04/01/16 275 lb 2 oz (124.8 kg)    Physical Exam  Constitutional: No distress.  Cardiovascular: Normal rate, regular rhythm and normal heart sounds.   Pulmonary/Chest: Effort normal and breath sounds normal.  Abdominal: Soft. Bowel sounds are normal. She exhibits no distension. There is no tenderness. There is no rebound and no guarding.  Liver edge nonpalpable, on percussion no change in sound until ribs were percussed  Neurological: She is alert. Gait normal.  Skin: Skin is warm and dry. She is not diaphoretic.     Assessment/Plan: Please see individual problem  list.  Depression Doing well. Not on medication. We'll discontinue Wellbutrin in her chart. She'll monitor and if depression returns she'll let us know.  Rib pain on right side Continues to have issues with this. Exam most consistent with rib tenderness though occasionally it does worsen with food intake. Discussed options of x-ray of ribs and chest versus GI referral given prior duodenal ulcer versus ultrasound of abdomen to evaluate. She opted for ultrasound. Discussed urine pregnancy test though she declined reporting that she is not pregnant. We'll see what comes of her ultrasound and then determine the next step.   Orders Placed This Encounter  Procedures  . US Abdomen Complete    Standing Status:   Future    Standing Expiration Date:   03/09/2018    Order Specific Question:   Reason for Exam (SYMPTOM  OR DIAGNOSIS REQUIRED)    Answer:   RUQ vs Right lower rib pain, persistent over a number of months    Order Specific Question:   Preferred imaging location?    Answer:   [facility]    Bridget AlarEric Janita Camberos, MD [facility]

## 2017-01-06 NOTE — Assessment & Plan Note (Signed)
Continues to have issues with this. Exam most consistent with rib tenderness though occasionally it does worsen with food intake. Discussed options of x-ray of ribs and chest versus GI referral given prior duodenal ulcer versus ultrasound of abdomen to evaluate. She opted for ultrasound. Discussed urine pregnancy test though she declined reporting that she is not pregnant. We'll see what comes of her ultrasound and then determine the next step.

## 2017-01-06 NOTE — Assessment & Plan Note (Signed)
Doing well. Not on medication. We'll discontinue Wellbutrin in her chart. She'll monitor and if depression returns she'll let us know.

## 2017-01-09 ENCOUNTER — Telehealth: Payer: Self-pay | Admitting: Family Medicine

## 2017-01-09 NOTE — Telephone Encounter (Signed)
Tried calling pt to let her know about her ultrasound that has been scheduled. Her mail box is full and I was not able to lvm to rtc. Aug. 1 at 11:00 am, nothing to eat or drink after midnight, arrive 15 minutes early for check in. [facility] [phone]([address]) [phone] to reschedule

## 2017-01-14 ENCOUNTER — Other Ambulatory Visit: Payer: Self-pay | Admitting: Family Medicine

## 2017-01-14 ENCOUNTER — Ambulatory Visit: Payer: 59

## 2017-01-14 ENCOUNTER — Telehealth: Payer: Self-pay | Admitting: Family Medicine

## 2017-01-14 DIAGNOSIS — R1084 Generalized abdominal pain: Secondary | ICD-10-CM

## 2017-01-14 DIAGNOSIS — R1011 Right upper quadrant pain: Secondary | ICD-10-CM

## 2017-01-14 NOTE — Telephone Encounter (Signed)
I would prefer to have the complete US to evaluate for other possible contributing factors given that she had a neg RUQ US a number of years ago. Thanks.

## 2017-01-14 NOTE — Telephone Encounter (Signed)
Lupita LeashDonna from the Ultrasound department and wanted to know if you would be willing to change the order for u/s of the abdomen from complete to limited do to the symptoms. Please advise, thank you!  [facility] @ [phone] or [phone]

## 2017-01-14 NOTE — Telephone Encounter (Signed)
Please check with the patient to see if she is having pain elsewhere in her abdomen so that we could potentially change the diagnosis to get the complete abdominal ultrasound covered. If it is only in the right upper quadrant we will need to likely change it to right upper quadrant ultrasound.

## 2017-01-14 NOTE — Telephone Encounter (Signed)
Left message to return call 

## 2017-01-14 NOTE — Telephone Encounter (Signed)
Spoke with Bridget Hull in the ultrasound department. She wanted to know if she can add generalized abdominal pain to the order to support the complete?

## 2017-01-15 ENCOUNTER — Ambulatory Visit
Admission: RE | Admit: 2017-01-15 | Discharge: 2017-01-15 | Disposition: A | Discharge status: Home / Self Care | Payer: 59 | Source: Ambulatory Visit | Attending: Family Medicine | Admitting: Family Medicine

## 2017-01-15 ENCOUNTER — Encounter: Payer: Self-pay | Admitting: Advanced Practice Midwife

## 2017-01-15 ENCOUNTER — Ambulatory Visit (INDEPENDENT_AMBULATORY_CARE_PROVIDER_SITE_OTHER): Payer: 59 | Admitting: Advanced Practice Midwife

## 2017-01-15 VITALS — BP 118/74 | Ht 69.0 in | Wt 278.0 lb

## 2017-01-15 DIAGNOSIS — Z124 Encounter for screening for malignant neoplasm of cervix: Secondary | ICD-10-CM

## 2017-01-15 DIAGNOSIS — R1011 Right upper quadrant pain: Secondary | ICD-10-CM | POA: Insufficient documentation

## 2017-01-15 DIAGNOSIS — Z01419 Encounter for gynecological examination (general) (routine) without abnormal findings: Secondary | ICD-10-CM | POA: Diagnosis not present

## 2017-01-15 DIAGNOSIS — K760 Fatty (change of) liver, not elsewhere classified: Secondary | ICD-10-CM | POA: Diagnosis not present

## 2017-01-15 DIAGNOSIS — R1084 Generalized abdominal pain: Secondary | ICD-10-CM | POA: Diagnosis not present

## 2017-01-15 NOTE — Progress Notes (Signed)
Patient ID: Bridget Hull, female   DOB: 08/10/1980, 36 y.o.   MRN: [id number]     Gynecology Annual Exam  PCP: Glori LuisSonnenberg, Eric G, MD  Chief Complaint:  Chief Complaint  Patient presents with  . Annual Exam    History of Present Illness: Patient is a 36 y.o. No obstetric history on file. presents for annual exam. The patient has no complaints today. She has never been diagnosed with PCOS but she thinks she has it. She has no plans for pregnancy and prefers not to take medications. She admits to eating healthy diet but she has no exercise. She admits to having a faint positive pregnancy test about a year ago but then she began bleeding a couple of weeks later. She did not seek health care at that time.   LMP: Patient's last menstrual period was 01/11/2017. Average Interval: irregular, every 3-4 months  Duration of flow: 3-10 days Heavy Menses: varies Clots: no Intermenstrual Bleeding: no Postcoital Bleeding: no Dysmenorrhea: no  The patient is sexually active. She currently uses condoms for contraception. She denies dyspareunia.  The patient does not perform self breast exams.  There is notable family history of breast or ovarian cancer in her family. Her paternal aunts had breast and ovarian cancer. She is considering genetic screening.  The patient wears seatbelts: yes.   The patient has regular exercise: no.    The patient denies current symptoms of depression.  She does take Wellbutrin.  Review of Systems: Review of Systems  Constitutional: Negative.   HENT: Negative.   Eyes: Negative.   Respiratory: Negative.   Cardiovascular: Negative.   Gastrointestinal: Negative.   Genitourinary: Negative.   Musculoskeletal: Negative.   Skin: Negative.   Neurological: Negative.   Endo/Heme/Allergies: Negative.   Psychiatric/Behavioral: Negative.     Past Medical History:  Past Medical History:  Diagnosis Date  . Asthma   . Chicken pox   . Depression     Past Surgical  History:  Past Surgical History:  Procedure Laterality Date  . ANKLE ARTHROSCOPY WITH OPEN REDUCTION INTERNAL FIXATION (ORIF)  07/2005   Hardware removal 07/2006    Gynecologic History:  Patient's last menstrual period was 01/11/2017. Contraception: condoms Last Pap: Results were: no abnormalities   Obstetric History: No obstetric history on file.  Family History:  Family History  Problem Relation Age of Onset  . Hypertension Mother   . Diabetes Father   . Hyperlipidemia Father   . Heart disease Father        Father became septic and subsequently had a STEMI  . Stroke Maternal Grandmother   . Heart disease Maternal Grandmother   . Cancer Maternal Grandfather   . Cancer Paternal Grandmother   . Heart disease Paternal Grandfather   . Diabetes Paternal Grandfather   . Drug abuse Brother     Social History:  Social History   Social History  . Marital status: Single    Spouse name: N/A  . Number of children: N/A  . Years of education: N/A   Occupational History  . Not on file.   Social History Main Topics  . Smoking status: Former Games developermoker  . Smokeless tobacco: Never Used  . Alcohol use No  . Drug use: No  . Sexual activity: Yes    Birth control/ protection: None   Other Topics Concern  . Not on file   Social History Narrative  . No narrative on file    Allergies:  Allergies  Allergen Reactions  .  Morphine And Related     Anaphylaxis  . Penicillins     Anaphylaxis     Medications: Prior to Admission medications   Medication Sig Start Date End Date Taking? Authorizing Provider  Prenatal Vit-Fe Fumarate-FA (PRENATAL MULTIVITAMIN) TABS tablet Take 1 tablet by mouth daily at 12 noon. Reported on 10/30/2015   Yes [provider]  buPROPion (WELLBUTRIN XL) 300 MG 24 hr tablet Take 1 tablet (300 mg total) by mouth daily. Patient not taking: Reported on 01/06/2017 07/08/16   Glori LuisSonnenberg, Eric G, MD    Physical Exam Vitals: Blood pressure 118/74, height  5\' 9"  (1.753 m), weight 278 lb (126.1 kg), last menstrual period 01/11/2017.  General: NAD HEENT: normocephalic, anicteric Thyroid: no enlargement, no palpable nodules Pulmonary: No increased work of breathing, CTAB Cardiovascular: RRR, distal pulses 2+ Breast: Breast symmetrical, no tenderness, no palpable nodules or masses, no skin or nipple retraction present, no nipple discharge.  No axillary or supraclavicular lymphadenopathy. Abdomen: NABS, soft, non-tender, non-distended.  Umbilicus without lesions.  No hepatomegaly, splenomegaly or masses palpable. No evidence of hernia  Genitourinary:  External: Normal external female genitalia.  Normal urethral meatus, normal  Bartholin's and Skene's glands.    Vagina: Normal vaginal mucosa, no evidence of prolapse.    Cervix: Grossly normal in appearance, no bleeding, no CMT  Uterus: Non-enlarged, mobile, normal contour.    Adnexa: ovaries non-enlarged, no adnexal masses  Rectal: deferred  Lymphatic: no evidence of inguinal lymphadenopathy Extremities: no edema, erythema, or tenderness Neurologic: Grossly intact Psychiatric: mood appropriate, affect full   Assessment: 36 y.o. No obstetric history on file. Well Woman exam with PAP smear  Plan: Problem List Items Addressed This Visit    None    Visit Diagnoses    Well woman exam with routine gynecological exam    -  Primary   Relevant Orders   IGP, Aptima HPV   Cervical cancer screening       Relevant Orders   IGP, Aptima HPV      1) STI screening was offered and declined  2) ASCCP guidelines and rational discussed.  Patient opts for every 3 years screening interval  3) Contraception - Education given regarding options for contraception. Patient prefers to continue using condoms  4) Routine healthcare maintenance including cholesterol, diabetes screening discussed managed by PCP   5) Increase healthy lifestyle exercise  6) Follow up 1 year for routine annual exam   Tresea MallJane  [employer], CNM

## 2017-01-15 NOTE — Telephone Encounter (Signed)
Was unable to reach patient, patient had us done

## 2017-01-19 LAB — IGP, APTIMA HPV
HPV APTIMA: NEGATIVE
PAP Smear Comment: 0

## 2017-07-13 ENCOUNTER — Ambulatory Visit (INDEPENDENT_AMBULATORY_CARE_PROVIDER_SITE_OTHER): Payer: No Typology Code available for payment source | Admitting: Family Medicine

## 2017-07-13 ENCOUNTER — Encounter: Payer: Self-pay | Admitting: Family Medicine

## 2017-07-13 ENCOUNTER — Other Ambulatory Visit: Payer: Self-pay

## 2017-07-13 VITALS — BP 122/90 | HR 95 | Temp 98.1°F | Wt 288.6 lb

## 2017-07-13 DIAGNOSIS — R0781 Pleurodynia: Secondary | ICD-10-CM

## 2017-07-13 DIAGNOSIS — L918 Other hypertrophic disorders of the skin: Secondary | ICD-10-CM

## 2017-07-13 DIAGNOSIS — F3342 Major depressive disorder, recurrent, in full remission: Secondary | ICD-10-CM

## 2017-07-13 DIAGNOSIS — R1011 Right upper quadrant pain: Secondary | ICD-10-CM | POA: Diagnosis not present

## 2017-07-13 DIAGNOSIS — R635 Abnormal weight gain: Secondary | ICD-10-CM | POA: Diagnosis not present

## 2017-07-13 LAB — TSH: TSH: 1.5 u[IU]/mL (ref 0.35–4.50)

## 2017-07-13 NOTE — Assessment & Plan Note (Signed)
Question whether or not her discomfort is in her ribs or underneath her ribs near her liver.  Prior ultrasound unremarkable.  We will obtain CT abdomen pelvis given chronicity.  Will refer to GI.

## 2017-07-13 NOTE — Assessment & Plan Note (Signed)
Patient reports continued issues with weight gain.  I discussed diet and exercise changes with her.  We will check her thyroid as well to rule out that is a contributing factor.

## 2017-07-13 NOTE — Progress Notes (Signed)
Bridget AlarEric Chicquita Mendel, MD Phone: [phone]  Bridget Hull is a 37 y.o. female who presents today for follow-up.  Depression: Not depressed at this time.  No longer on Wellbutrin.  No SI.  She continues to have right upper quadrant discomfort and tenderness over right lower ribs.  Notes it tends to start about a week before her menstrual cycle and improves quite a bit after her menses starts.  Starts back up after she ovulates.  No worse with fatty foods.  This has been chronically going on for some time now.  Prior abdominal ultrasound with no significant abnormalities other than fatty liver.  Has had prior workup with HIDA scan and ultrasound in the past.  She is gained some weight over the last several years.  She is walking 4 miles a day a couple days a week and staying active the other days.  Eats fairly healthfully.  Has not been able to lose any weight.  Social History   Tobacco Use  Smoking Status Former Smoker  Smokeless Tobacco Never Used     ROS see history of present illness  Objective  Physical Exam Vitals:   07/13/17 0805  BP: 122/90  Pulse: 95  Temp: 98.1 F (36.7 C)  SpO2: 98%    BP Readings from Last 3 Encounters:  07/13/17 122/90  01/15/17 118/74  01/06/17 138/86   Wt Readings from Last 3 Encounters:  07/13/17 288 lb 9.6 oz (130.9 kg)  01/15/17 278 lb (126.1 kg)  01/06/17 277 lb 9.6 oz (125.9 kg)    Physical Exam  Constitutional: No distress.  Cardiovascular: Normal rate, regular rhythm and normal heart sounds.  Pulmonary/Chest: Effort normal and breath sounds normal.  Abdominal: Soft. Bowel sounds are normal. She exhibits no distension. There is no tenderness. There is no rebound and no guarding.    Musculoskeletal: She exhibits no edema.  Neurological: She is alert. Gait normal.  Skin: Skin is warm and dry. She is not diaphoretic.     Assessment/Plan: Please see individual problem list.  No problem-specific Assessment & Plan notes found  for this encounter.   Orders Placed This Encounter  Procedures  . CT Abdomen Pelvis W Contrast    Standing Status:   Future    Standing Expiration Date:   10/12/2018    Order Specific Question:   If indicated for the ordered procedure, I authorize the administration of contrast media per Radiology protocol    Answer:   Yes    Order Specific Question:   Is patient pregnant?    Answer:   No    Order Specific Question:   Preferred imaging location?    Answer:   [facility]    Order Specific Question:   Radiology Contrast Protocol - do NOT remove file path    Answer:   [email or web address]  . TSH  . Ambulatory referral to Gastroenterology    Referral Priority:   Routine    Referral Type:   Consultation    Referral Reason:   Specialty Services Required    Number of Visits Requested:   1  . Ambulatory referral to Dermatology    Referral Priority:   Routine    Referral Type:   Consultation    Referral Reason:   Specialty Services Required    Requested Specialty:   Dermatology    Number of Visits Requested:   1    No orders of the defined types were placed in this encounter.    Bridget AlarEric Adriahna Shearman, MD [facility]  [place]

## 2017-07-13 NOTE — Assessment & Plan Note (Signed)
Well-controlled. Continue to monitor. 

## 2017-07-13 NOTE — Patient Instructions (Addendum)
Nice to see you. Please work on diet and exercise.  We will get you to see GI and have a CT scan for your abdominal discomfort.  Diet Recommendations  Starchy (carb) foods: Bread, rice, pasta, potatoes, corn, cereal, grits, crackers, bagels, muffins, all baked goods.  (Fruits, milk, and yogurt also have carbohydrate, but most of these foods will not spike your blood sugar as the starchy foods will.)  A few fruits do cause high blood sugars; use small portions of bananas (limit to 1/2 at a time), grapes, watermelon, oranges, and most tropical fruits.    Protein foods: Meat, fish, poultry, eggs, dairy foods, and beans such as pinto and kidney beans (beans also provide carbohydrate).   1. Eat at least 3 meals and 1-2 snacks per day. Never go more than 4-5 hours while awake without eating. Eat breakfast within the first hour of getting up.   2. Limit starchy foods to TWO per meal and ONE per snack. ONE portion of a starchy  food is equal to the following:   - ONE slice of bread (or its equivalent, such as half of a hamburger bun).   - 1/2 cup of a "scoopable" starchy food such as potatoes or rice.   - 15 grams of carbohydrate as shown on food label.  3. Include at every meal: a protein food, a carb food, and vegetables and/or fruit.   - Obtain twice the volume of veg's as protein or carbohydrate foods for both lunch and dinner.   - Fresh or frozen veg's are best.   - Keep frozen veg's on hand for a quick vegetable serving.

## 2017-07-15 ENCOUNTER — Encounter: Payer: Self-pay | Admitting: Gastroenterology

## 2017-07-27 ENCOUNTER — Ambulatory Visit
Admission: RE | Admit: 2017-07-27 | Discharge: 2017-07-27 | Disposition: A | Discharge status: Home / Self Care | Payer: No Typology Code available for payment source | Source: Ambulatory Visit | Attending: Family Medicine | Admitting: Family Medicine

## 2017-07-27 DIAGNOSIS — R1011 Right upper quadrant pain: Secondary | ICD-10-CM | POA: Insufficient documentation

## 2017-07-27 DIAGNOSIS — K760 Fatty (change of) liver, not elsewhere classified: Secondary | ICD-10-CM | POA: Diagnosis not present

## 2017-07-27 DIAGNOSIS — M47817 Spondylosis without myelopathy or radiculopathy, lumbosacral region: Secondary | ICD-10-CM | POA: Diagnosis not present

## 2017-07-28 ENCOUNTER — Telehealth: Payer: Self-pay | Admitting: Family Medicine

## 2017-07-28 NOTE — Telephone Encounter (Signed)
Attempted to call the patient.  There is no answer.  I left a message asking her to call back to the office.  We will attempt to call her again in the next 1-2 days.

## 2017-07-30 NOTE — Telephone Encounter (Signed)
I attempted to contact the patient again regarding her results.  It went straight to voicemail and her mailbox was full.  I will send her a my chart message.

## 2017-07-31 ENCOUNTER — Telehealth: Payer: Self-pay | Admitting: Family Medicine

## 2017-07-31 DIAGNOSIS — R9341 Abnormal radiologic findings on diagnostic imaging of renal pelvis, ureter, or bladder: Secondary | ICD-10-CM

## 2017-07-31 NOTE — Telephone Encounter (Signed)
Sent mychart message

## 2017-07-31 NOTE — Telephone Encounter (Signed)
-----   Message from Hildred LaserBrian James Budzyn, MD sent at 07/31/2017 11:48 AM EST ----- Regarding: RE: question about CT scan Dr. Birdie SonsSonnenberg,  Her bladder does look odd on the CT images especially considering her age. She should probably see us to discuss cystoscopy.  Hope this helps, Hadley PenBrian Budzyn   ----- Message ----- From: Glori LuisSonnenberg, Lexus Shampine G, MD Sent: 07/30/2017   4:54 PM To: Hildred LaserBrian James Budzyn, MD Subject: question about CT scan                         Hi Dr Sherryl BartersBudzyn,   I obtained a CT scan on this patient to evaluate RUQ pain and they incidentally noted a contracted urinary bladder with wall thickening that was incompletely assessed. I wanted to see if the wall thickening could be related to her bladder being contracted or if it could be something else that should be evaluated further. Thanks for your help.  Marikay AlarEric Kiora Hallberg   ----- Message ----- From: Leory PlowmanInterface, Rad Results In Sent: 07/27/2017   8:11 AM To: Glori LuisEric G Moorea Boissonneault, MD

## 2017-07-31 NOTE — Telephone Encounter (Signed)
Please try to call the patient and let her know I heard back from urology who recommended evaluation with them given the appearance of her bladder on her CT images.  Once we contact her I will place a referral.  If you are unable to get her on the phone you can send a my chart message and a letter in the mail.  Thanks.

## 2017-07-31 NOTE — Telephone Encounter (Signed)
Left message to return call, ok for pec to speak to patient about DR.Sonnenbergs message

## 2017-08-03 NOTE — Addendum Note (Signed)
Addended by: Glori LuisSONNENBERG, ERIC G on: 08/03/2017 04:51 PM   Modules accepted: Orders

## 2017-08-03 NOTE — Telephone Encounter (Signed)
Please advise 

## 2017-08-03 NOTE — Addendum Note (Signed)
Addended by: Glori LuisSONNENBERG, Terrence Wishon G on: 08/03/2017 08:46 PM   Modules accepted: Orders

## 2017-08-03 NOTE — Telephone Encounter (Signed)
There is certainly the possibility that could have played a role though I would suggest she see urology given that they observed the images and felt she should be evaluated.  Please also see if she got the result message that I sent through my chart regarding the results.  We will need to follow-up on the slightly enlarged lymph node as well with a follow-up exam in about a month.

## 2017-08-04 NOTE — Telephone Encounter (Signed)
Noted.  Referral already placed. 

## 2017-08-04 NOTE — Telephone Encounter (Signed)
Patient notified and did receive your message, she is ok being referred to urology. Patient is scheduled for office visit tomorrow with you for follow up exam

## 2017-08-05 ENCOUNTER — Ambulatory Visit (INDEPENDENT_AMBULATORY_CARE_PROVIDER_SITE_OTHER): Payer: No Typology Code available for payment source | Admitting: Family Medicine

## 2017-08-05 ENCOUNTER — Encounter: Payer: Self-pay | Admitting: Family Medicine

## 2017-08-05 DIAGNOSIS — K760 Fatty (change of) liver, not elsewhere classified: Secondary | ICD-10-CM | POA: Diagnosis not present

## 2017-08-05 DIAGNOSIS — R9341 Abnormal radiologic findings on diagnostic imaging of renal pelvis, ureter, or bladder: Secondary | ICD-10-CM | POA: Diagnosis not present

## 2017-08-05 DIAGNOSIS — W273XXA Contact with needle (sewing), initial encounter: Secondary | ICD-10-CM

## 2017-08-05 DIAGNOSIS — Z7721 Contact with and (suspected) exposure to potentially hazardous body fluids: Secondary | ICD-10-CM | POA: Diagnosis not present

## 2017-08-05 DIAGNOSIS — W461XXA Contact with contaminated hypodermic needle, initial encounter: Secondary | ICD-10-CM | POA: Insufficient documentation

## 2017-08-05 DIAGNOSIS — R599 Enlarged lymph nodes, unspecified: Secondary | ICD-10-CM | POA: Diagnosis not present

## 2017-08-05 NOTE — Assessment & Plan Note (Signed)
History of this in the past.  No recent sticks.  Negative workup previously.  She is interested in HIV testing as well as hep C testing.  She would like to have all her lab work done at once from us and GI.  Once she sees GI she will plan on making a lab appointment for repeat testing.

## 2017-08-05 NOTE — Assessment & Plan Note (Signed)
Possibly related to the patient having just urinated though given the abnormal appearance on urology review she will keep her appointment with urology for further evaluation.

## 2017-08-05 NOTE — Patient Instructions (Signed)
Nice to see you. Please keep the appointment with GI and urology. Please let GI know that she would like your lab work done here and have them send us a message with what they need.

## 2017-08-05 NOTE — Assessment & Plan Note (Signed)
Minimally enlarged lymph node of indeterminate significance.  No other lymphadenopathy.  Asymptomatic.  I discussed that there was no recommended follow-up in the CT read.  Discussed the options would be for her to monitor versus follow-up exam with me versus repeat imaging.  Given the small nature and lack of symptoms she opted to monitor herself.

## 2017-08-05 NOTE — Progress Notes (Signed)
Marikay AlarEric Stavroula Rohde, MD Phone: [phone]  Bridget Hull is a 37 y.o. female who presents today for follow-up.  Recently had CT abdomen and pelvis which revealed fatty liver with an indeterminate lesion in the liver.  She has had a chronic right upper quadrant pain for some time now.  She has an appointment with GI.  Her bladder was felt to be abnormal looking as well.  I had a urologist review the CT scan and he felt it looked abnormal for her age.  She does have a urology appointment.  She notes her bladder has never held that much urine.  She does report she had just urinated prior to the scan and that may have contributed.  No hematuria.  She was also noted to have a right external iliac lymph node that was minimally enlarged and was of indeterminate nature.  No other lymphadenopathy.  She notes no weight loss, itching or night sweats.  She does report history of needle sticks in the past that have been evaluated appropriately through occupational health.  The first 1 was around 2010 and was from an HIV positive patient.  She underwent prophylaxis and testing for up to a year.  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  Social History   Tobacco Use  Smoking Status Former Smoker  Smokeless Tobacco Never Used     ROS see history of present illness  Objective  Physical Exam Vitals:   08/05/17 0850  BP: 124/80  Pulse: 89  Temp: 98 F (36.7 C)  SpO2: 99%    BP Readings from Last 3 Encounters:  08/05/17 124/80  07/13/17 122/90  01/15/17 118/74   Wt Readings from Last 3 Encounters:  08/05/17 283 lb 12.8 oz (128.7 kg)  07/13/17 288 lb 9.6 oz (130.9 kg)  01/15/17 278 lb (126.1 kg)    Physical Exam  Constitutional: No distress.  Cardiovascular: Normal rate, regular rhythm and normal heart sounds.  Pulmonary/Chest: Effort normal and breath sounds normal.  Abdominal: Soft. Bowel sounds are normal. She  exhibits no distension. There is no tenderness. There is no rebound and no guarding.  Musculoskeletal: She exhibits no edema.  Neurological: She is alert. Gait normal.  Skin: Skin is warm and dry. She is not diaphoretic.  Inguinal exam completed with patient's tightness on at her request, no palpable inguinal lymphadenopathy     Assessment/Plan: Please see individual problem list.  Fatty liver Chronic issue of right upper quadrant versus right lower rib discomfort.  CT scan revealed fatty liver and enlarged liver with an indeterminate lesion.  She will see GI as planned.  She will ask to have her lab work if any needed from them done through our office.  Abnormal CT scan, bladder Possibly related to the patient having just urinated though given the abnormal appearance on urology review she will keep her appointment with urology for further evaluation.  Enlarged lymph node Minimally enlarged lymph node of indeterminate significance.  No other lymphadenopathy.  Asymptomatic.  I discussed that there was no recommended follow-up in the CT read.  Discussed the options would be for her to monitor versus follow-up exam with me versus repeat imaging.  Given the small nature and lack of symptoms she opted to monitor herself.  Accidental needlestick injury with exposure to body fluid History of this in the past.  No recent sticks.  Negative workup previously.  She is interested in HIV testing as  well as hep C testing.  She would like to have all her lab work done at once from [place] and GI.  Once she sees GI she will plan on making a lab appointment for repeat testing.   Orders Placed This Encounter  Procedures  . HIV antibody (with reflex)    Standing Status:   Future    Standing Expiration Date:   08/05/2018    No orders of the defined types were placed in this encounter.    Marikay Alar, MD [facility]

## 2017-08-05 NOTE — Assessment & Plan Note (Signed)
Chronic issue of right upper quadrant versus right lower rib discomfort.  CT scan revealed fatty liver and enlarged liver with an indeterminate lesion.  She will see GI as planned.  She will ask to have her lab work if any needed from them done through our office.

## 2017-08-12 ENCOUNTER — Encounter: Payer: Self-pay | Admitting: Gastroenterology

## 2017-08-12 ENCOUNTER — Ambulatory Visit: Payer: No Typology Code available for payment source | Admitting: Gastroenterology

## 2017-08-12 VITALS — BP 138/86 | HR 91 | Temp 97.7°F | Ht 69.0 in | Wt 285.0 lb

## 2017-08-12 DIAGNOSIS — M7918 Myalgia, other site: Secondary | ICD-10-CM

## 2017-08-12 NOTE — Progress Notes (Signed)
Bridget Hull Alif Petrak MD, MRCP(U.K) [address]  [address]  [place], [place] [address]  Main: [phone]  Fax: [phone]   Gastroenterology Consultation  Referring Provider:     Glori LuisSonnenberg, Eric G, MD Primary Care Physician:  Glori LuisSonnenberg, Eric G, MD Primary Gastroenterologist:  Dr. Wyline Hull Bridget Hull  Reason for Consultation:     RUQ pain         HPI:   Bridget Hull is a 37 y.o. y/o female referred for consultation & management  by Dr. Birdie SonsSonnenberg, Yehuda MaoEric G, MD.     She has been referred for RUQ pain . RUQ USG in 01/2017 showed only fatty liver, She had a CT scan of the abdomen on 07/27/17 which showed fatty liver. Slightly prominent lymph node in the rt external iliac 1.1 cm    Abdominal pain: Onset: 3 years, gradually getting worse, all the time  Site :RUQ  Radiation: localized  Severity :pysical activity makes it worse  Ashby DawesNature of pain: sore  Aggravating factors: physical activity  Relieving factors :resting - always there  Weight loss: steadily gained- pain worse as she has gained weight  NSAID use: Sometimes takes motrin for a headaches- not often  PPI use :none  Gall bladder surgery: none - none in the family  Frequency of bowel movements: every other day  Change in bowel movements: no  Relief with bowel movements: no  Gas/Bloating/Abdominal distension: no    She feels at times was worse during her msntruation. -not consistent , no similar pain during sexual intercourse.     Past Medical History:  Diagnosis Date  . Chicken pox   . Depression     Past Surgical History:  Procedure Laterality Date  . ANKLE ARTHROSCOPY WITH OPEN REDUCTION INTERNAL FIXATION (ORIF)  07/2005   Hardware removal 07/2006    Prior to Admission medications   Medication Sig Start Date End Date Taking? Authorizing Provider  doxylamine, Sleep, (UNISOM) 25 MG tablet Take 25 mg by mouth at bedtime as needed.    [provider]  UNABLE TO FIND Med Name:Alteril    [provider]      Family History  Problem Relation Age of Onset  . Hypertension Mother   . Diabetes Father   . Hyperlipidemia Father   . Heart disease Father        Father became septic and subsequently had a STEMI  . Stroke Maternal Grandmother   . Heart disease Maternal Grandmother   . Cancer Maternal Grandfather   . Cancer Paternal Grandmother   . Heart disease Paternal Grandfather   . Diabetes Paternal Grandfather   . Drug abuse Brother      Social History   Tobacco Use  . Smoking status: Former Games developermoker  . Smokeless tobacco: Never Used  Substance Use Topics  . Alcohol use: No    Alcohol/week: 0.0 oz  . Drug use: No    Allergies as of 08/12/2017 - Review Complete 08/05/2017  Allergen Reaction Noted  . Morphine and related  05/23/2015  . Penicillins  05/23/2015    Review of Systems:    All systems reviewed and negative except where noted in HPI.   Physical Exam:  There were no vitals taken for this visit. No LMP recorded. Psych:  Alert and cooperative. Normal mood and affect. General:   Alert,  Well-developed, well-nourished, pleasant and cooperative in NAD Head:  Normocephalic and atraumatic. Eyes:  Sclera clear, no icterus.   Conjunctiva pink. Ears:  Normal auditory acuity. Nose:  No deformity, discharge, or lesions. Mouth:  No deformity or lesions,oropharynx pink & moist. Neck:  Supple; no masses or thyromegaly. Lungs:  Respirations even and unlabored.  Clear throughout to auscultation.   No wheezes, crackles, or rhonchi. No acute distress.Very tender over rt lower ribs in the mid clavicular line. Heart:  Regular rate and rhythm; no murmurs, clicks, rubs, or gallops. Abdomen:  Normal bowel sounds.  No bruits.  Soft, non-tender and non-distended without masses, hepatosplenomegaly or hernias noted.  No guarding or rebound tenderness.    Neurologic:  Alert and oriented x3;  grossly normal neurologically. Skin:  Intact without significant lesions or rashes. No jaundice. Lymph  Nodes:  No significant cervical adenopathy. Psych:  Alert and cooperative. Normal mood and affect.  Imaging Studies: Ct Abdomen Pelvis W Contrast  Result Date: 07/27/2017 CLINICAL DATA:  37 year old female with right upper quadrant pain for 3 years. Initial encounter. EXAM: CT ABDOMEN AND PELVIS WITH CONTRAST TECHNIQUE: Multidetector CT imaging of the abdomen and pelvis was performed using the standard protocol following bolus administration of intravenous contrast. CONTRAST:  125 cc Isovue-300. COMPARISON:  01/15/2017 abdominal ultrasound. No comparison CT. FINDINGS: Lower chest: Minimal basilar atelectasis/scarring. Heart size within normal limits. Hepatobiliary: Enlarged fatty liver spanning over 18.9 cm. 7 mm low-density structure posterior aspect of the right lobe liver too small to adequately characterize. No calcified gallstones. Pancreas: No worrisome mass or inflammation. Spleen: No mass or enlargement. Adrenals/Urinary Tract: No obstructing stone or hydronephrosis. No worrisome renal or adrenal mass. Contracted urinary bladder with wall thickening incompletely assessed. Stomach/Bowel: No extraluminal bowel inflammatory process or worrisome mass identified. Vascular/Lymphatic: No aortic aneurysm or large vessel occlusion. Slightly prominent size right external iliac lymph node with short axis dimension of 1.1 cm. Reproductive: Suspect several small ovarian follicles bilaterally without dominant worrisome mass. Other: No bowel containing hernia or free air. Musculoskeletal: Bilateral sacroiliac joint degenerative changes with partial fusion. IMPRESSION: Enlarged fatty liver spanning over 18.9 cm. 7 mm low-density structure posterior aspect of the right lobe of the liver too small to adequately characterize. Slightly prominent size right external iliac lymph node with short axis dimension 1.1 cm of questionable significance/etiology. Suspect several small ovarian follicles bilaterally without worrisome  dominant ovarian lesion. Bilateral sacroiliac joint degenerative changes with partial fusion. Electronically Signed   By: Lacy Duverney M.D.   On: 07/27/2017 08:08    Assessment and Plan:   Hortense Vecchiarelli is a 37 y.o. y/o female has been referred for RUQ pain. History and examination findings suggest musculoskeltal in origin . Incidentally found 7 mm non specific lesion in the liver and enlarged iliac lymph node.   Plan  1. Suggest weight loss, NSAID trial - if needed can have a nerve block via interventional pain  2. Suggest repeat RUQ USG in 6-8 months to evaluate liver lesion 7 mm     Follow up PRN  Dr Bridget Mood MD,MRCP(U.K)

## 2017-08-26 ENCOUNTER — Ambulatory Visit: Payer: Self-pay | Admitting: Urology

## 2017-10-07 ENCOUNTER — Ambulatory Visit (INDEPENDENT_AMBULATORY_CARE_PROVIDER_SITE_OTHER): Payer: No Typology Code available for payment source | Admitting: Advanced Practice Midwife

## 2017-10-07 ENCOUNTER — Encounter: Payer: Self-pay | Admitting: Advanced Practice Midwife

## 2017-10-07 VITALS — BP 136/82 | HR 94 | Ht 69.0 in | Wt 282.0 lb

## 2017-10-07 DIAGNOSIS — N750 Cyst of Bartholin's gland: Secondary | ICD-10-CM | POA: Diagnosis not present

## 2017-10-07 NOTE — Patient Instructions (Signed)
Bartholin Cyst or Abscess A Bartholin cyst is a fluid-filled sac that forms on a Bartholin gland. Bartholin glands are small glands that are located within the folds of skin (labia) along the sides of the lower opening of the vagina. These glands produce a fluid to moisten the outside of the vagina during sexual intercourse. A Bartholin cyst causes a bulge on the side of the vagina. A cyst that is not large or infected may not cause symptoms or problems. However, if the fluid within the cyst becomes infected, the cyst can turn into an abscess. An abscess may cause discomfort or pain. What are the causes? A Bartholin cyst may develop when the duct of the gland becomes blocked. In many cases, the cause of this is not known. Various kinds of bacteria can cause the cyst to become infected and develop into an abscess. What increases the risk? You may be at an increased risk of developing a Bartholin cyst or abscess if:  You are a woman of reproductive age.  You have a history of previous Bartholin cysts or abscesses.  You have diabetes.  You have a sexually transmitted disease (STD).  What are the signs or symptoms? The severity of symptoms varies depending on the size of the cyst and whether it is infected. Symptoms may include:  A bulge or swelling near the lower opening of your vagina.  Discomfort or pain.  Redness.  Pain during sexual intercourse.  Pain when walking.  Fluid draining from the area.  How is this diagnosed? Your health care provider may make a diagnosis based on your symptoms and a physical exam. He or she will look for swelling in your vaginal area. Blood tests may be done to check for infections. A sample of fluid from the cyst or abscess may also be taken to be tested in a lab. How is this treated? Small cysts that are not infected may not require any treatment. These often go away on their own. Yourhealth care provider will recommend hot baths and the use of warm  compresses. These may also be part of the treatment for an abscess. Treatment options for a large cyst or abscess may include:  Antibiotic medicine.  A surgical procedure to drain the abscess. One of the following procedures may be done: ? Incision and drainage. An incision is made in the cyst or abscess so that the fluid drains out. A catheter may be placed inside the cyst so that it does not close and fill up with fluid again. The catheter will be removed after you have a follow-up visit with a specialist (gynecologist). ? Marsupialization. The cyst or abscess is opened and kept open by stitching the edges of the skin to the walls of the cyst or abscess. This allows it to continue to drain and not fill up with fluid again.  If you have cysts or abscesses that keep returning and have required incision and drainage multiple times, your health care provider may talk to you about surgery to remove the Bartholin gland. Follow these instructions at home:  Take medicines only as directed by your health care provider.  If you were prescribed an antibiotic medicine, finish it all even if you start to feel better.  Apply warm, wet compresses to the area or take warm, shallow baths that cover your pelvic region (sitz baths) several times a day or as directed by your health care provider.  Do not squeeze the cyst or apply heavy pressure to it.    Do not have sexual intercourse until the cyst has gone away.  If your cyst or abscess was opened, a small piece of gauze or a drain may have been placed in the area to allow drainage. Do not remove the gauze or the drain until directed by your health care provider.  Wear feminine pads-not tampons-as needed for any drainage or bleeding.  Keep all follow-up visits as directed by your health care provider. This is important. How is this prevented? Take these steps to help prevent a Bartholin cyst from returning:  Practice good hygiene.  Clean your vaginal  area with mild soap and a soft cloth when you bathe.  Practice safe sex to prevent STDs.  Contact a health care provider if:  You have increased pain, swelling, or redness in the area of the cyst.  Puslike drainage is coming from the cyst.  You have a fever. This information is not intended to replace advice given to you by your health care provider. Make sure you discuss any questions you have with your health care provider. Document Released: 06/02/2005 Document Revised: 11/08/2015 Document Reviewed: 01/16/2014 Elsevier Interactive Patient Education  2018 [employer]  

## 2017-10-07 NOTE — Progress Notes (Signed)
Patient ID: Bridget Hull, female   DOB: 03/05/1981, 37 y.o.   MRN: [id number]   S: The patient is here today with complaint of swelling in her labia that she thinks is a bartholin's cyst. Her boyfriend first noticed the swelling about 3 weeks ago. She did not feel any tenderness until about a week ago. At that time it was very tender and about the size of an egg. She was unable to sit comfortably. She could feel the pressure from it both rectally and vaginally. She denies any redness, fever, itching, irritation, discharge, odor, increased risk of STD. She has tried hot water to the area with some relief. Today it is only slightly tender and reduced in size. She declines STD testing today.   Review of systems is otherwise negative  O: Vital Signs: BP 136/82 (BP Location: Left Arm, Patient Position: Sitting, Cuff Size: Large)   Pulse 94   Ht 5\' 9"  (1.753 m)   Wt 282 lb (127.9 kg)   LMP 09/09/2017 (Exact Date)   BMI 41.64 kg/m  Constitutional: Well nourished, well developed female in no acute distress.  HEENT: normal Skin: Warm and dry.  Cardiovascular: Regular rate and rhythm.   Respiratory: Clear to auscultation bilateral. Normal respiratory effort  MS: normal gait, normal bilateral lower extremity ROM/strength/stability.  Pelvic exam:  is not limited by body habitus EGBUS: 3x4 cm cyst palpated in right lower vulva/vagina, mild tenderness to palpation, skin is intact and not inflamed around the cyst Vagina: within normal limits  Cervix: not evaluated  A: 37 yo G0P0 female with bartholin's cyst- resolving  P: Antibiotics not recommended at this time given that cyst appears to be resolving Moist/hot compresses to affected area 2x per day Return to clinic PRN for worsening symptoms  Bridget Hull, CNM

## 2017-12-17 ENCOUNTER — Encounter: Payer: Self-pay | Admitting: Advanced Practice Midwife

## 2018-01-11 ENCOUNTER — Encounter: Payer: Self-pay | Admitting: Family Medicine

## 2018-01-11 ENCOUNTER — Ambulatory Visit (INDEPENDENT_AMBULATORY_CARE_PROVIDER_SITE_OTHER): Payer: No Typology Code available for payment source | Admitting: Family Medicine

## 2018-01-11 VITALS — BP 120/78 | HR 79 | Temp 98.2°F | Ht 69.0 in | Wt 279.8 lb

## 2018-01-11 DIAGNOSIS — K769 Liver disease, unspecified: Secondary | ICD-10-CM | POA: Diagnosis not present

## 2018-01-11 DIAGNOSIS — K760 Fatty (change of) liver, not elsewhere classified: Secondary | ICD-10-CM

## 2018-01-11 DIAGNOSIS — F3342 Major depressive disorder, recurrent, in full remission: Secondary | ICD-10-CM

## 2018-01-11 DIAGNOSIS — R9341 Abnormal radiologic findings on diagnostic imaging of renal pelvis, ureter, or bladder: Secondary | ICD-10-CM

## 2018-01-11 DIAGNOSIS — L918 Other hypertrophic disorders of the skin: Secondary | ICD-10-CM | POA: Diagnosis not present

## 2018-01-11 NOTE — Progress Notes (Signed)
  Marikay AlarEric Naoko Diperna, MD Phone: [phone]  Bridget Hull is a 37 y.o. female who presents today for f/u.  CC: Fatty liver, depression, skin tags  Fatty liver: She did see GI particularly for the right upper quadrant pain she had been having.  They felt it was her ribs.  They advised NSAID trial.  They did recommend 6 to 6214-month follow-up for ultrasound of the indeterminate lesion in her liver.  Patient reports she would like to hold off on that until January due to cost concerns with her current insurance plan.  She has been going to the gym and getting in the pool.  She is also been watching what she eats to some degree.  She reports she is down 6 pounds at home.  She denies depression.  She never saw the urologist.  She did not necessarily feel like she needed to as she had just emptied her bladder prior to the CT scan.  There is also a cost concern.  Skin tags: She reports chronic history of these around her neck and around her bra line.  They do get irritated and bleed at times.  Social History   Tobacco Use  Smoking Status Never Smoker  Smokeless Tobacco Never Used     ROS see history of present illness  Objective  Physical Exam Vitals:   01/11/18 0803  BP: 120/78  Pulse: 79  Temp: 98.2 F (36.8 C)  SpO2: 100%    BP Readings from Last 3 Encounters:  01/11/18 120/78  10/07/17 136/82  08/12/17 138/86   Wt Readings from Last 3 Encounters:  01/11/18 279 lb 12.8 oz (126.9 kg)  10/07/17 282 lb (127.9 kg)  08/12/17 285 lb (129.3 kg)    Physical Exam  Constitutional: No distress.  Cardiovascular: Normal rate, regular rhythm and normal heart sounds.  Pulmonary/Chest: Effort normal and breath sounds normal.    Abdominal: Soft. Bowel sounds are normal. She exhibits no distension. There is no tenderness.  Musculoskeletal: She exhibits no edema.  Neurological: She is alert.  Skin: Skin is warm and dry. She is not diaphoretic.  Skin tags noted along right aspect of  neck shirt line, patient deferred looking at skin tags along her bra line related to how her bra fits today   Assessment/Plan: Please see individual problem list.  Fatty liver She will continue to work on diet and exercise.  Liver lesion Discussed need for follow-up ultrasound per GI.  Patient has deferred this at this time and would prefer to have it done in January at follow-up.  Abnormal CT scan, bladder Patient deferred evaluation for this.  Depression No depression.  She will continue to monitor.  Cutaneous skin tags Refer to dermatology.    Orders Placed This Encounter  Procedures  . Ambulatory referral to Dermatology    Referral Priority:   Routine    Referral Type:   Consultation    Referral Reason:   Specialty Services Required    Requested Specialty:   Dermatology    Number of Visits Requested:   1    No orders of the defined types were placed in this encounter.    Marikay AlarEric Iasia Forcier, MD [facility] [place]

## 2018-01-11 NOTE — Assessment & Plan Note (Signed)
Refer to dermatology 

## 2018-01-11 NOTE — Assessment & Plan Note (Signed)
No depression.  She will continue to monitor.

## 2018-01-11 NOTE — Patient Instructions (Signed)
Nice to see you. We will see you back in January and plan on getting ultrasound of your liver at that time. We will refer you to dermatology for your skin tags.

## 2018-01-11 NOTE — Assessment & Plan Note (Signed)
Discussed need for follow-up ultrasound per GI.  Patient has deferred this at this time and would prefer to have it done in January at follow-up.

## 2018-01-11 NOTE — Assessment & Plan Note (Signed)
She will continue to work on diet and exercise. 

## 2018-01-11 NOTE — Assessment & Plan Note (Signed)
Patient deferred evaluation for this.

## 2018-01-20 ENCOUNTER — Encounter: Payer: Self-pay | Admitting: Advanced Practice Midwife

## 2018-01-20 ENCOUNTER — Other Ambulatory Visit (HOSPITAL_COMMUNITY)
Admission: RE | Admit: 2018-01-20 | Discharge: 2018-01-20 | Disposition: A | Discharge status: Home / Self Care | Payer: No Typology Code available for payment source | Source: Ambulatory Visit | Attending: Advanced Practice Midwife | Admitting: Advanced Practice Midwife

## 2018-01-20 ENCOUNTER — Ambulatory Visit (INDEPENDENT_AMBULATORY_CARE_PROVIDER_SITE_OTHER): Payer: No Typology Code available for payment source | Admitting: Advanced Practice Midwife

## 2018-01-20 VITALS — BP 120/80 | HR 79 | Ht 69.0 in | Wt 274.0 lb

## 2018-01-20 DIAGNOSIS — Z01419 Encounter for gynecological examination (general) (routine) without abnormal findings: Secondary | ICD-10-CM

## 2018-01-20 DIAGNOSIS — B3789 Other sites of candidiasis: Secondary | ICD-10-CM

## 2018-01-20 DIAGNOSIS — Z124 Encounter for screening for malignant neoplasm of cervix: Secondary | ICD-10-CM

## 2018-01-20 DIAGNOSIS — Z1231 Encounter for screening mammogram for malignant neoplasm of breast: Secondary | ICD-10-CM

## 2018-01-20 DIAGNOSIS — Z01411 Encounter for gynecological examination (general) (routine) with abnormal findings: Secondary | ICD-10-CM | POA: Diagnosis not present

## 2018-01-20 DIAGNOSIS — Z113 Encounter for screening for infections with a predominantly sexual mode of transmission: Secondary | ICD-10-CM

## 2018-01-20 DIAGNOSIS — Z1239 Encounter for other screening for malignant neoplasm of breast: Secondary | ICD-10-CM

## 2018-01-20 LAB — HM PAP SMEAR: HM Pap smear: NEGATIVE

## 2018-01-20 MED ORDER — NYSTATIN 100000 UNIT/GM EX CREA: 1.0000 "application " | TOPICAL_CREAM | Freq: Two times a day (BID) | CUTANEOUS | 1 refills | Status: DC

## 2018-01-20 NOTE — Progress Notes (Addendum)
Patient ID: Bridget Hull, female   DOB: May 01, 1981, 37 y.o.   MRN: [id number]     Gynecology Annual Exam   PCP: Leone Haven, MD  Chief Complaint:  Chief Complaint  Patient presents with  . Gynecologic Exam    History of Present Illness: Patient is a 37 y.o. G0P0000 presents for annual exam. The patient has complaint today of itching around her anus and perineum. She has tried anti-fungal (monistat) and antibiotic ointment without success. She also has hemorrhoid but denies current inflammation or itching of that tissue.   LMP: Patient's last menstrual period was 01/17/2018. Average Interval: irregular, every 1-4 months  Duration of flow: 2-9 days Heavy Menses: varies Clots: no Intermenstrual Bleeding: no Postcoital Bleeding: no Dysmenorrhea: no  The patient is sexually active. She currently uses condoms for contraception. She denies dyspareunia.  The patient does not perform self breast exams.  There is notable family history of breast or ovarian cancer in her family. Her 2 paternal aunts had breast cancer in their late 104's. Her paternal grandmother had ovarian cancer and her paternal aunt had ovarian cancer. BRCA screening is offered to the patient. She wants to check with her insurance first to be sure it is covered.   The patient wears seatbelts: yes.   The patient has regular exercise: yes.  She is in the gym for 1-2 hours most days doing cardio and then she usually finishes work out in the pool. She admits to continued increased carbohydrates in her diet. She admits adequate hydration.   The patient denies current symptoms of depression.    Review of Systems: Review of Systems  Constitutional: Negative.   HENT: Negative.   Eyes: Negative.   Respiratory: Negative.   Cardiovascular: Negative.   Gastrointestinal:       Perianal itching  Genitourinary: Negative.   Musculoskeletal: Negative.   Skin: Negative.   Neurological: Negative.   Endo/Heme/Allergies:  Negative.   Psychiatric/Behavioral: Negative.     Past Medical History:  Past Medical History:  Diagnosis Date  . Chicken pox   . Depression     Past Surgical History:  Past Surgical History:  Procedure Laterality Date  . ANKLE ARTHROSCOPY WITH OPEN REDUCTION INTERNAL FIXATION (ORIF)  07/2005   Hardware removal 07/2006    Gynecologic History:  Patient's last menstrual period was 01/17/2018. Contraception: condoms Last Pap: 1 year ago Results were: no abnormalities   Obstetric History: G0P0000  Family History:  Family History  Problem Relation Age of Onset  . Hypertension Mother   . Diabetes Father   . Hyperlipidemia Father   . Heart disease Father        Father became septic and subsequently had a STEMI  . Stroke Maternal Grandmother   . Heart disease Maternal Grandmother   . Cancer Maternal Grandfather   . Cancer Paternal Grandmother   . Heart disease Paternal Grandfather   . Diabetes Paternal Grandfather   . Drug abuse Brother     Social History:  Social History   Socioeconomic History  . Marital status: Single    Spouse name: Not on file  . Number of children: Not on file  . Years of education: Not on file  . Highest education level: Not on file  Occupational History  . Not on file  Social Needs  . Financial resource strain: Not on file  . Food insecurity:    Worry: Not on file    Inability: Not on file  . Transportation needs:  Medical: Not on file    Non-medical: Not on file  Tobacco Use  . Smoking status: Never Smoker  . Smokeless tobacco: Never Used  Substance and Sexual Activity  . Alcohol use: No    Alcohol/week: 0.0 oz  . Drug use: No  . Sexual activity: Yes    Birth control/protection: None  Lifestyle  . Physical activity:    Days per week: Not on file    Minutes per session: Not on file  . Stress: Not on file  Relationships  . Social connections:    Talks on phone: Not on file    Gets together: Not on file    Attends religious  service: Not on file    Active member of club or organization: Not on file    Attends meetings of clubs or organizations: Not on file    Relationship status: Not on file  . Intimate partner violence:    Fear of current or ex partner: Not on file    Emotionally abused: Not on file    Physically abused: Not on file    Forced sexual activity: Not on file  Other Topics Concern  . Not on file  Social History Narrative  . Not on file    Allergies:  Allergies  Allergen Reactions  . Morphine And Related     Anaphylaxis  . Penicillins     Anaphylaxis     Medications: Prior to Admission medications   Medication Sig Start Date End Date Taking? Authorizing Provider  doxylamine, Sleep, (UNISOM) 25 MG tablet Take 25 mg by mouth at bedtime as needed.   Yes [provider]  nystatin cream (MYCOSTATIN) Apply 1 application topically 2 (two) times daily. 01/20/18   Rod Can, CNM  UNABLE TO FIND Med Name:Alteril    [provider]    Physical Exam Vitals: Blood pressure 120/80, pulse 79, height '5\' 9"'  (1.753 m), weight 274 lb (124.3 kg), last menstrual period 01/17/2018.  General: NAD HEENT: normocephalic, anicteric Thyroid: no enlargement, no palpable nodules Pulmonary: No increased work of breathing, CTAB Cardiovascular: RRR, distal pulses 2+ Breast: Breast symmetrical, no tenderness, no palpable nodules or masses, no skin or nipple retraction present, no nipple discharge.  No axillary or supraclavicular lymphadenopathy. Abdomen: NABS, soft, non-tender, non-distended.  Umbilicus without lesions.  No hepatomegaly, splenomegaly or masses palpable. No evidence of hernia  Genitourinary:  External: Normal external female genitalia.  Normal urethral meatus, normal Bartholin's and Skene's glands.    Vagina: Normal vaginal mucosa, no evidence of prolapse.    Cervix: Grossly normal in appearance, no bleeding, no CMT  Uterus: deferred for no concerns/shared decision making     Adnexa: deferred for no concerns/shared decision making  Rectal: shiny/pink appearing perineal/anal skin, no lesions, skin tags of anus  Lymphatic: no evidence of inguinal lymphadenopathy Extremities: no edema, erythema, or tenderness Neurologic: Grossly intact Psychiatric: mood appropriate, affect full   Assessment: 37 y.o. G0P0000 routine annual exam  Plan: Problem List Items Addressed This Visit    None    Visit Diagnoses    Well woman exam with routine gynecological exam    -  Primary   Relevant Orders   Cytology - PAP   Perianal candidiasis       Relevant Medications   nystatin cream (MYCOSTATIN)   Screen for sexually transmitted diseases       Relevant Orders   Cytology - PAP   Cervical cancer screening       Relevant Orders  Cytology - PAP   Breast cancer screening       Relevant Orders   MM DIGITAL SCREENING BILATERAL      1) STI screening  was offered and accepted  2)  ASCCP guidelines and rational discussed.  Patient opts for yearly screening interval  3) Contraception - the patient is currently using  condoms.  She is happy with her current form of contraception and plans to continue  4) Routine healthcare maintenance including cholesterol, diabetes screening discussed managed by PCP  5) Return in 1 year (on 01/21/2019) for annual established gyn.   Rod Can, [facility], [facility] 01/20/2018, 2:24 PM

## 2018-01-20 NOTE — Addendum Note (Signed)
Addended by: Tresea MallGLEDHILL, Zackariah Vanderpol on: 01/20/2018 02:25 PM   Modules accepted: Orders

## 2018-01-20 NOTE — Patient Instructions (Addendum)
Preventive Care 18-39 Years, Female Preventive care refers to lifestyle choices and visits with your health care provider that can promote health and wellness. What does preventive care include?  A yearly physical exam. This is also called an annual well check.  Dental exams once or twice a year.  Routine eye exams. Ask your health care provider how often you should have your eyes checked.  Personal lifestyle choices, including: ? Daily care of your teeth and gums. ? Regular physical activity. ? Eating a healthy diet. ? Avoiding tobacco and drug use. ? Limiting alcohol use. ? Practicing safe sex. ? Taking vitamin and mineral supplements as recommended by your health care provider. What happens during an annual well check? The services and screenings done by your health care provider during your annual well check will depend on your age, overall health, lifestyle risk factors, and family history of disease. Counseling Your health care provider may ask you questions about your:  Alcohol use.  Tobacco use.  Drug use.  Emotional well-being.  Home and relationship well-being.  Sexual activity.  Eating habits.  Work and work Statistician.  Method of birth control.  Menstrual cycle.  Pregnancy history.  Screening You may have the following tests or measurements:  Height, weight, and BMI.  Diabetes screening. This is done by checking your blood sugar (glucose) after you have not eaten for a while (fasting).  Blood pressure.  Lipid and cholesterol levels. These may be checked every 5 years starting at age 38.  Skin check.  Hepatitis C blood test.  Hepatitis B blood test.  Sexually transmitted disease (STD) testing.  BRCA-related cancer screening. This may be done if you have a family history of breast, ovarian, tubal, or peritoneal cancers.  Pelvic exam and Pap test. This may be done every 3 years starting at age 38. Starting at age 30, this may be done  every 5 years if you have a Pap test in combination with an HPV test.  Discuss your test results, treatment options, and if necessary, the need for more tests with your health care provider. Vaccines Your health care provider may recommend certain vaccines, such as:  Influenza vaccine. This is recommended every year.  Tetanus, diphtheria, and acellular pertussis (Tdap, Td) vaccine. You may need a Td booster every 10 years.  Varicella vaccine. You may need this if you have not been vaccinated.  HPV vaccine. If you are 39 or younger, you may need three doses over 6 months.  Measles, mumps, and rubella (MMR) vaccine. You may need at least one dose of MMR. You may also need a second dose.  Pneumococcal 13-valent conjugate (PCV13) vaccine. You may need this if you have certain conditions and were not previously vaccinated.  Pneumococcal polysaccharide (PPSV23) vaccine. You may need one or two doses if you smoke cigarettes or if you have certain conditions.  Meningococcal vaccine. One dose is recommended if you are age 68-21 years and a first-year college student living in a residence hall, or if you have one of several medical conditions. You may also need additional booster doses.  Hepatitis A vaccine. You may need this if you have certain conditions or if you travel or work in places where you may be exposed to hepatitis A.  Hepatitis B vaccine. You may need this if you have certain conditions or if you travel or work in places where you may be exposed to hepatitis B.  Haemophilus influenzae type b (Hib) vaccine. You may need this  if you have certain risk factors.  Talk to your health care provider about which screenings and vaccines you need and how often you need them. This information is not intended to replace advice given to you by your health care provider. Make sure you discuss any questions you have with your health care provider. Document Released: 07/29/2001 Document Revised:  02/20/2016 Document Reviewed: 04/03/2015 Elsevier Interactive Patient Education  2018 [place] refers to food and lifestyle choices that are based on the traditions of countries located on the [employer]. This way of eating has been shown to help prevent certain conditions and improve outcomes for people who have chronic diseases, like kidney disease and heart disease. What are tips for following this plan? Lifestyle Cook and eat meals together with your family, when possible. Drink enough fluid to keep your urine clear or pale yellow. Be physically active every day. This includes: Aerobic exercise like running or swimming. Leisure activities like gardening, walking, or housework. Get 7-8 hours of sleep each night. If recommended by your health care provider, drink red wine in moderation. This means 1 glass a day for nonpregnant women and 2 glasses a day for men. A glass of wine equals 5 oz (150 mL). Reading food labels Check the serving size of packaged foods. For foods such as rice and pasta, the serving size refers to the amount of cooked product, not dry. Check the total fat in packaged foods. Avoid foods that have saturated fat or trans fats. Check the ingredients list for added sugars, such as corn syrup. Shopping At the grocery store, buy most of your food from the areas near the walls of the store. This includes: Fresh fruits and vegetables (produce). Grains, beans, nuts, and seeds. Some of these may be available in unpackaged forms or large amounts (in bulk). Fresh seafood. Poultry and eggs. Low-fat dairy products. Buy whole ingredients instead of prepackaged foods. Buy fresh fruits and vegetables in-season from local farmers markets. Buy frozen fruits and vegetables in resealable bags. If you do not have access to quality fresh seafood, buy precooked frozen shrimp or canned fish, such as tuna, salmon, or sardines. Buy small  amounts of raw or cooked vegetables, salads, or olives from the deli or salad bar at your store. Stock your pantry so you always have certain foods on hand, such as olive oil, canned tuna, canned tomatoes, rice, pasta, and beans. Cooking Cook foods with extra-virgin olive oil instead of using butter or other vegetable oils. Have meat as a side dish, and have vegetables or grains as your main dish. This means having meat in small portions or adding small amounts of meat to foods like pasta or stew. Use beans or vegetables instead of meat in common dishes like chili or lasagna. Experiment with different cooking methods. Try roasting or broiling vegetables instead of steaming or sauteing them. Add frozen vegetables to soups, stews, pasta, or rice. Add nuts or seeds for added healthy fat at each meal. You can add these to yogurt, salads, or vegetable dishes. Marinate fish or vegetables using olive oil, lemon juice, garlic, and fresh herbs. Meal planning Plan to eat 1 vegetarian meal one day each week. Try to work up to 2 vegetarian meals, if possible. Eat seafood 2 or more times a week. Have healthy snacks readily available, such as: Vegetable sticks with hummus. Greek yogurt. Fruit and nut trail mix. Eat balanced meals throughout the week. This includes: Fruit: 2-3 servings  a day Vegetables: 4-5 servings a day Low-fat dairy: 2 servings a day Fish, poultry, or lean meat: 1 serving a day Beans and legumes: 2 or more servings a week Nuts and seeds: 1-2 servings a day Whole grains: 6-8 servings a day Extra-virgin olive oil: 3-4 servings a day Limit red meat and sweets to only a few servings a month What are my food choices? Mediterranean diet Recommended Grains: Whole-grain pasta. Brown rice. Bulgar wheat. Polenta. Couscous. Whole-wheat bread. [place]. Vegetables: Artichokes. Beets. Broccoli. Cabbage. Carrots. Eggplant. Green beans. Chard. Kale. Spinach. Onions. Leeks. Peas. Squash.  Tomatoes. Peppers. Radishes. Fruits: Apples. Apricots. Avocado. Berries. Bananas. Cherries. Dates. Figs. Grapes. Lemons. Melon. Oranges. Peaches. Plums. Pomegranate. Meats and other protein foods: Beans. Almonds. Sunflower seeds. Pine nuts. Peanuts. [place]. Salmon. Scallops. Shrimp. [place]. Tilapia. Clams. Oysters. Eggs. Dairy: Low-fat milk. Cheese. Greek yogurt. Beverages: Water. Red wine. Herbal tea. Fats and oils: Extra virgin olive oil. Avocado oil. Grape seed oil. Sweets and desserts: [place] yogurt with honey. Baked apples. Poached pears. Trail mix. Seasoning and other foods: Basil. Cilantro. Coriander. Cumin. Mint. Parsley. Sage. Rosemary. Tarragon. Garlic. Oregano. Thyme. Pepper. Balsalmic vinegar. Tahini. Hummus. Tomato sauce. Olives. Mushrooms. Limit these Grains: Prepackaged pasta or rice dishes. Prepackaged cereal with added sugar. Vegetables: Deep fried potatoes (french fries). Fruits: Fruit canned in syrup. Meats and other protein foods: Beef. Pork. Lamb. Poultry with skin. Hot dogs. [place] Salines. Dairy: Ice cream. Sour cream. Whole milk. Beverages: Juice. Sugar-sweetened soft drinks. Beer. Liquor and spirits. Fats and oils: Butter. Canola oil. Vegetable oil. Beef fat (tallow). Lard. Sweets and desserts: Cookies. Cakes. Pies. Candy. Seasoning and other foods: Mayonnaise. Premade sauces and marinades. The items listed may not be a complete list. Talk with your dietitian about what dietary choices are right for you. Summary The Mediterranean diet includes both food and lifestyle choices. Eat a variety of fresh fruits and vegetables, beans, nuts, seeds, and whole grains. Limit the amount of red meat and sweets that you eat. Talk with your health care provider about whether it is safe for you to drink red wine in moderation. This means 1 glass a day for nonpregnant women and 2 glasses a day for men. A glass of wine equals 5 oz (150 mL). This information is not intended to replace advice given to  you by your health care provider. Make sure you discuss any questions you have with your health care provider. Document Released: 01/24/2016 Document Revised: 02/26/2016 Document Reviewed: 01/24/2016 Elsevier Interactive Patient Education  [employer].

## 2018-01-22 LAB — CYTOLOGY - PAP
Chlamydia: NEGATIVE
Diagnosis: NEGATIVE
HPV: NOT DETECTED
Neisseria Gonorrhea: NEGATIVE
Trichomonas: NEGATIVE

## 2018-07-14 ENCOUNTER — Ambulatory Visit (INDEPENDENT_AMBULATORY_CARE_PROVIDER_SITE_OTHER): Payer: No Typology Code available for payment source | Admitting: Family Medicine

## 2018-07-14 ENCOUNTER — Encounter: Payer: Self-pay | Admitting: Family Medicine

## 2018-07-14 VITALS — BP 98/62 | HR 68 | Temp 98.2°F | Ht 69.0 in | Wt 275.2 lb

## 2018-07-14 DIAGNOSIS — K760 Fatty (change of) liver, not elsewhere classified: Secondary | ICD-10-CM

## 2018-07-14 DIAGNOSIS — R42 Dizziness and giddiness: Secondary | ICD-10-CM | POA: Diagnosis not present

## 2018-07-14 DIAGNOSIS — Z6841 Body Mass Index (BMI) 40.0 and over, adult: Secondary | ICD-10-CM

## 2018-07-14 DIAGNOSIS — K769 Liver disease, unspecified: Secondary | ICD-10-CM | POA: Diagnosis not present

## 2018-07-14 DIAGNOSIS — Z0001 Encounter for general adult medical examination with abnormal findings: Secondary | ICD-10-CM

## 2018-07-14 DIAGNOSIS — Z13220 Encounter for screening for lipoid disorders: Secondary | ICD-10-CM

## 2018-07-14 DIAGNOSIS — Z130 Encounter for screening for diseases of the blood and blood-forming organs and certain disorders involving the immune mechanism: Secondary | ICD-10-CM

## 2018-07-14 DIAGNOSIS — Z1329 Encounter for screening for other suspected endocrine disorder: Secondary | ICD-10-CM

## 2018-07-14 NOTE — Assessment & Plan Note (Signed)
Ultrasound ordered to follow-up on this lesion.  Patient is unsure if she is going to proceed with this given cost concerns.

## 2018-07-14 NOTE — Assessment & Plan Note (Signed)
Physical exam completed.  Discussed continuing diet and exercise.  I suspect her weight will start to improve with time.  She will continue to follow with gynecology regarding her irregular menstrual cycles.  I did offer testing for PCOS with testosterone testing and discussed ultrasound though she deferred these.  Overall clinical picture would fit with PCOS particularly given small follicles seen on prior CT scan.  Patient has declined BRCA testing as well.  She will return for fasting lab.

## 2018-07-14 NOTE — Assessment & Plan Note (Signed)
Patient deferred Dix-Hallpike.  This could be BPPV or Mnire's.  Given recent increase in frequency and pulsating sensation we will refer to ENT for evaluation.

## 2018-07-14 NOTE — Patient Instructions (Signed)
Nice to see you. We will have you return for fasting labs. We will get you set up with ENT for evaluation of your vertigo. If you would like to complete evaluation for PCOS please let us know.

## 2018-07-14 NOTE — Progress Notes (Signed)
Tommi Rumps, MD Phone: [phone]  Bridget Hull is a 38 y.o. female who presents today for cpe.  Patient is exercising extensively with about 2-1/2 hours Monday through Friday.  She does note some improvement in muscle mass and strength.  She does note some of her clothes fit looser.  Her diet is fairly healthy as well.  She has cut down on sodium.  No sweet drinks.  She is eating mostly vegetables, salads, green bowls, and chicken and shrimp.  She is been working on this consistently since December. Pap smear 01/20/2018 with negative cells and negative HPV.  She follows with gynecology for this.  She has irregular menstrual cycles and will go months without 1 and then have 1.  Last was several weeks ago.  She is sexually active with one partner.  She has discussed her irregular menses with gynecology.  It seems as though the patient may have PCOS though has not undergone formal evaluation for this.  She does note some hirsutism as well as difficulty losing weight. The patient notes family history of breast cancer in both of her paternal aunts.  1 of them had ovarian cancer as well.  Her paternal grandmother had ovarian cancer.  No family history of breast cancer.  Patient declines BRCA testing. Patient is up-to-date on tetanus vaccination, flu vaccination, and HIV screening. No tobacco use, alcohol use, or illicit drug use. She sees a Pharmacist, community twice yearly.  Ophthalmologist once yearly. Patient notes her father passed away suddenly last year.  She notes it has been hard though she has developed a close relationship with her brother.  She does note her mother is no longer speaking to her as the patient cut her mother off and that has been hard.  Vertigo: Patient notes she has a long history of this.  Previously it was occurring once yearly though recently it has occurred once weekly and she can have symptoms up to 12 hours.  She notes she can get into one position where she does not have any  symptoms.  It is incited by movements of her head though it is not a specific movement that brings it on.  She does note some right ear fullness with this and feels as though her eyes beat back and forth.  She does note a pulsating sensation in her right ear as well.  Rare tinnitus.  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  This has not changed.  Her right pupil is dilated compared to her left pupil.  Active Ambulatory Problems    Diagnosis Date Noted  . Encounter for general adult medical examination with abnormal findings 07/04/2015  . Depression 10/30/2015  . Fatty liver 04/01/2016  . Weight gain 07/13/2017  . Abnormal CT scan, bladder 08/05/2017  . Enlarged lymph node 08/05/2017  . Accidental needlestick injury with exposure to body fluid 08/05/2017  . Liver lesion 01/11/2018  . Cutaneous skin tags 01/11/2018  . Vertigo 07/14/2018   Resolved Ambulatory Problems    Diagnosis Date Noted  . Acute bronchitis 05/23/2015   Past Medical History:  Diagnosis Date  . Chicken pox     Family History  Problem Relation Age of Onset  . Hypertension Mother   . Diabetes Father   . Hyperlipidemia Father   . Heart disease Father        Father became septic and subsequently had a STEMI  . Stroke Maternal Grandmother   . Heart disease Maternal  Grandmother   . Cancer Maternal Grandfather   . Ovarian cancer Paternal Grandmother   . Heart disease Paternal Grandfather   . Diabetes Paternal Grandfather   . Drug abuse Brother   . Ovarian cancer Paternal Aunt   . Breast cancer Paternal Aunt   . Breast cancer Paternal Aunt     Social History   Socioeconomic History  . Marital status: Single    Spouse name: Not on file  . Number of children: Not on file  . Years of education: Not on file  . Highest education level: Not on file  Occupational History  . Not on file  Social Needs  . Financial resource strain: Not on file  . Food insecurity:    Worry:  Not on file    Inability: Not on file  . Transportation needs:    Medical: Not on file    Non-medical: Not on file  Tobacco Use  . Smoking status: Never Smoker  . Smokeless tobacco: Never Used  Substance and Sexual Activity  . Alcohol use: No    Alcohol/week: 0.0 standard drinks  . Drug use: No  . Sexual activity: Yes    Birth control/protection: None  Lifestyle  . Physical activity:    Days per week: Not on file    Minutes per session: Not on file  . Stress: Not on file  Relationships  . Social connections:    Talks on phone: Not on file    Gets together: Not on file    Attends religious service: Not on file    Active member of club or organization: Not on file    Attends meetings of clubs or organizations: Not on file    Relationship status: Not on file  . Intimate partner violence:    Fear of current or ex partner: Not on file    Emotionally abused: Not on file    Physically abused: Not on file    Forced sexual activity: Not on file  Other Topics Concern  . Not on file  Social History Narrative  . Not on file    ROS  General:  Negative for nexplained weight loss, fever Skin: Negative for new or changing mole, sore that won't heal HEENT: Negative for trouble hearing, trouble seeing, ringing in ears, mouth sores, hoarseness, change in voice, dysphagia. CV:  Negative for chest pain, dyspnea, edema, palpitations Resp: Negative for cough, dyspnea, hemoptysis GI: Negative for nausea, vomiting, diarrhea, constipation, abdominal pain, melena, hematochezia. GU: Negative for dysuria, incontinence, urinary hesitance, hematuria, vaginal or penile discharge, polyuria, sexual difficulty, lumps in testicle or breasts MSK: Negative for muscle cramps or aches, joint pain or swelling Neuro: Positive for dizziness, negative for headaches, weakness, numbness, passing out/fainting Psych: Negative for depression, anxiety, memory problems  Objective  Physical Exam Vitals:    07/14/18 0826  BP: 98/62  Pulse: 68  Temp: 98.2 F (36.8 C)  SpO2: 99%    BP Readings from Last 3 Encounters:  07/14/18 98/62  01/20/18 120/80  01/11/18 120/78   Wt Readings from Last 3 Encounters:  07/14/18 275 lb 3.2 oz (124.8 kg)  01/20/18 274 lb (124.3 kg)  01/11/18 279 lb 12.8 oz (126.9 kg)    Physical Exam Constitutional:      General: She is not in acute distress.    Appearance: She is not diaphoretic.  HENT:     Head: Normocephalic and atraumatic.     Right Ear: Tympanic membrane normal.     Left Ear:  Tympanic membrane normal.     Mouth/Throat:     Mouth: Mucous membranes are moist.     Pharynx: Oropharynx is clear.  Eyes:     Conjunctiva/sclera: Conjunctivae normal.     Comments: Right-sided pupil greater in size and left-sided pupil, both are reactive to light  Cardiovascular:     Rate and Rhythm: Normal rate and regular rhythm.     Heart sounds: Normal heart sounds.  Pulmonary:     Effort: Pulmonary effort is normal.     Breath sounds: Normal breath sounds.  Abdominal:     General: Bowel sounds are normal. There is no distension.     Palpations: Abdomen is soft.     Tenderness: There is no abdominal tenderness. There is no guarding or rebound.  Lymphadenopathy:     Cervical: No cervical adenopathy.  Skin:    General: Skin is warm and dry.  Neurological:     Mental Status: She is alert.     Comments: Right pupil larger than left chronically, otherwise CN 3-12 intact, 5/5 strength in bilateral biceps, triceps, grip, quads, hamstrings, plantar and dorsiflexion, sensation to light touch intact in bilateral UE and LE, normal gait  Psychiatric:        Mood and Affect: Mood normal.      Assessment/Plan:   Encounter for general adult medical examination with abnormal findings Physical exam completed.  Discussed continuing diet and exercise.  I suspect her weight will start to improve with time.  She will continue to follow with gynecology regarding her  irregular menstrual cycles.  I did offer testing for PCOS with testosterone testing and discussed ultrasound though she deferred these.  Overall clinical picture would fit with PCOS particularly given small follicles seen on prior CT scan.  Patient has declined BRCA testing as well.  She will return for fasting lab.  Liver lesion Ultrasound ordered to follow-up on this lesion.  Patient is unsure if she is going to proceed with this given cost concerns.  Vertigo Patient deferred Dix-Hallpike.  This could be BPPV or Mnire's.  Given recent increase in frequency and pulsating sensation we will refer to ENT for evaluation.   Orders Placed This Encounter  Procedures  . US Abdomen Limited RUQ    Standing Status:   Future    Standing Expiration Date:   09/12/2019    Order Specific Question:   Reason for Exam (SYMPTOM  OR DIAGNOSIS REQUIRED)    Answer:   follow-up on liver lesion    Order Specific Question:   Preferred imaging location?    Answer:   [facility]  . Comp Met (CMET)    Standing Status:   Future    Standing Expiration Date:   07/15/2019  . HgB A1c    Standing Status:   Future    Standing Expiration Date:   07/15/2019  . TSH    Standing Status:   Future    Standing Expiration Date:   07/15/2019  . CBC    Standing Status:   Future    Standing Expiration Date:   07/15/2019  . Lipid panel    Standing Status:   Future    Standing Expiration Date:   07/15/2019  . Ambulatory referral to ENT    Referral Priority:   Routine    Referral Type:   Consultation    Referral Reason:   Specialty Services Required    Requested Specialty:   Otolaryngology    Number of Visits Requested:  1    No orders of the defined types were placed in this encounter.    Tommi Rumps, MD [place]

## 2018-07-21 ENCOUNTER — Telehealth: Payer: Self-pay | Admitting: Family Medicine

## 2018-07-21 NOTE — Telephone Encounter (Signed)
Called pt to ask some questions as to what she wanted lab work done for and why she is wanting the lab work done at the hospital.   When I called the patient the call went to VM and was unable to leave a VM due to mailbox being full.

## 2018-07-21 NOTE — Telephone Encounter (Signed)
Copied from CRM [id number]. Topic: General - Inquiry >> Jul 21, 2018 10:40 AM Wyonia Hough E wrote: Reason for CRM: Pt inquired about being able to get Lab orders or out patient script to have labs done at the hospital. Pt will check with her insurance / please advise

## 2018-07-22 ENCOUNTER — Other Ambulatory Visit: Payer: No Typology Code available for payment source

## 2019-04-29 IMAGING — CT CT ABD-PELV W/ CM
2 of 4 series · 16 of 46 positions shown, 18 images · IV contrast (agent unspecified)
Comparison: 01/15/2017 abdominal ultrasound. No comparison CT.

CLINICAL DATA: 36-year-old female with right upper quadrant pain
for 3 years. Initial encounter.

EXAM:
CT ABDOMEN AND PELVIS WITH CONTRAST
TECHNIQUE: Multidetector CT imaging of the abdomen and pelvis was performed
using the standard protocol following bolus administration of
intravenous contrast.
CONTRAST:  125 cc [id number].

[Series 2: routine abd/pel with · axial · 0.74mm/px · z∈[-1066,-601]mm · 13 of 103 slices shown, 15 images]
[im 5/103  soft-tissue]
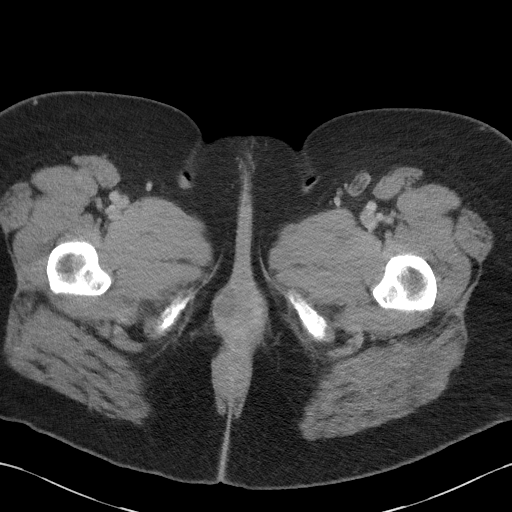
[im 5/103  bone]
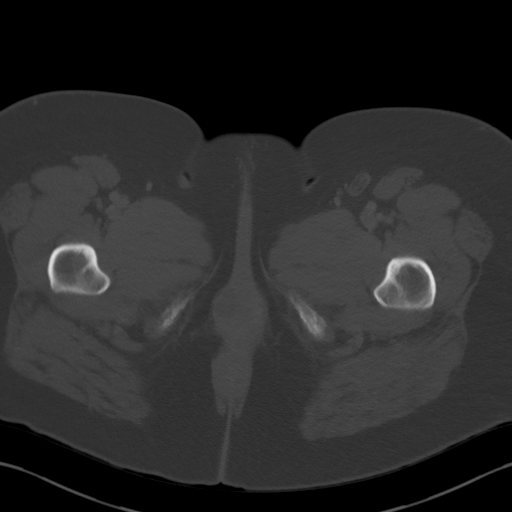
[im 13/103  soft-tissue]
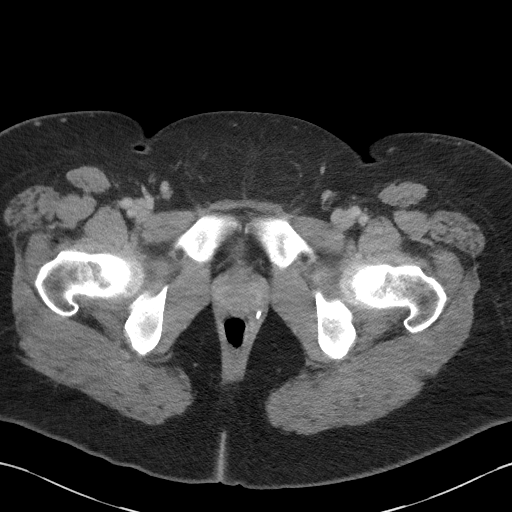
[im 21/103  soft-tissue]
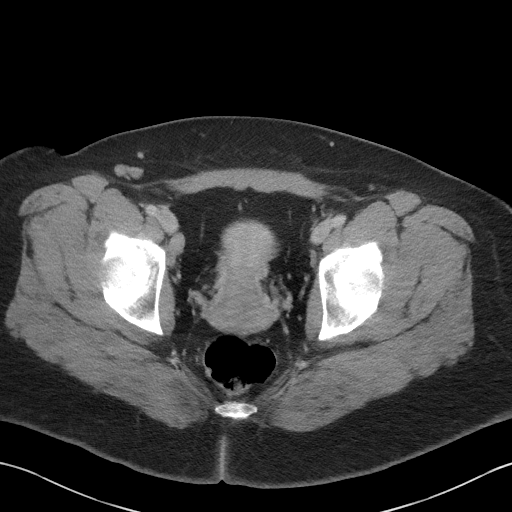
[im 29/103  soft-tissue]
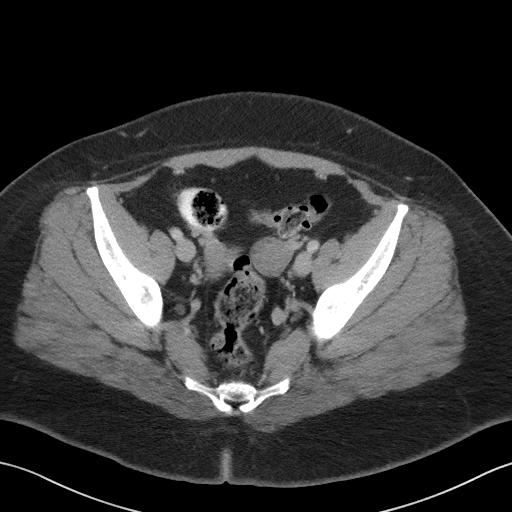
[im 37/103  soft-tissue]
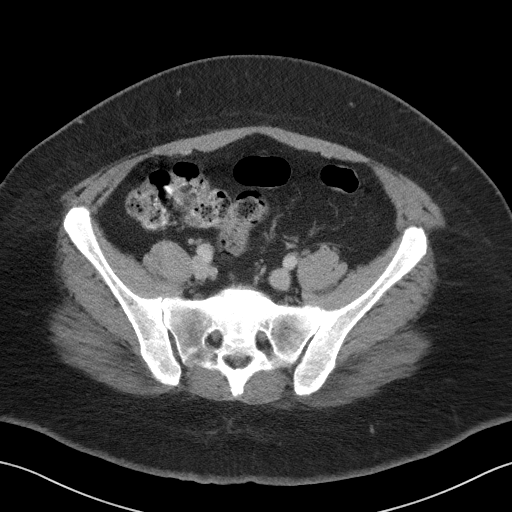
[im 45/103  soft-tissue]
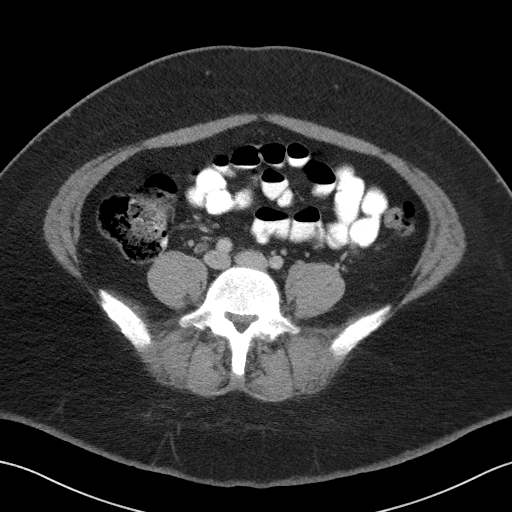
[im 54/103  soft-tissue]
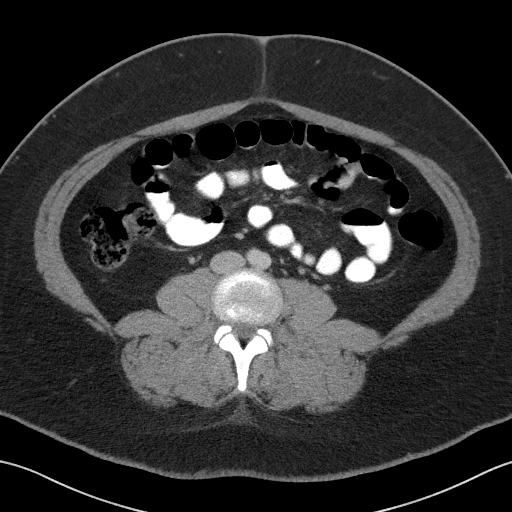
[im 58/103  soft-tissue]
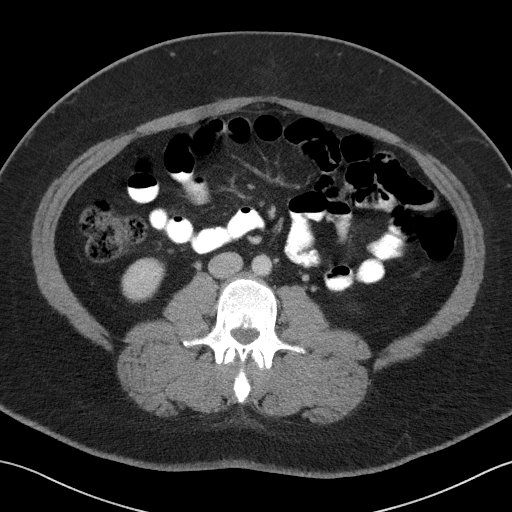
[im 66/103  soft-tissue]
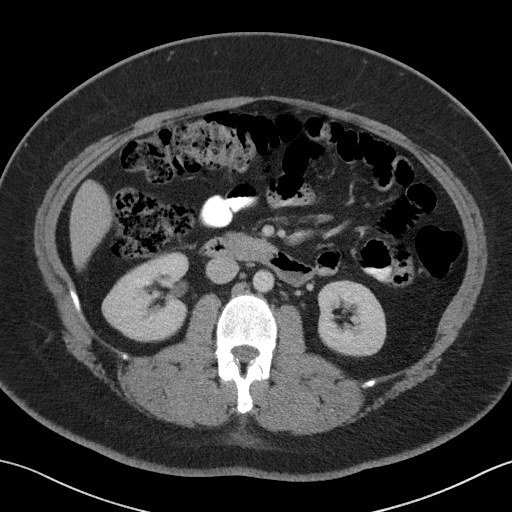
[im 66/103  bone]
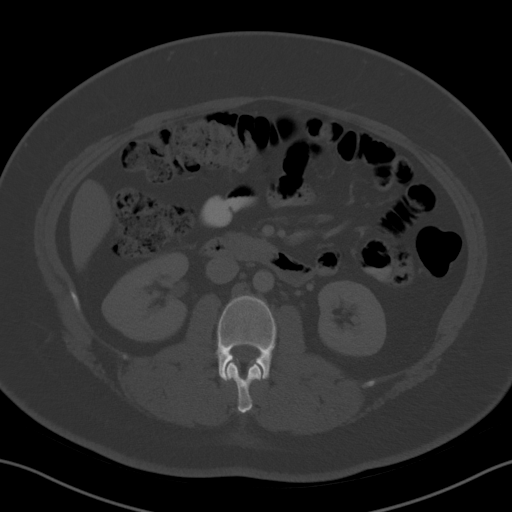
[im 74/103  soft-tissue]
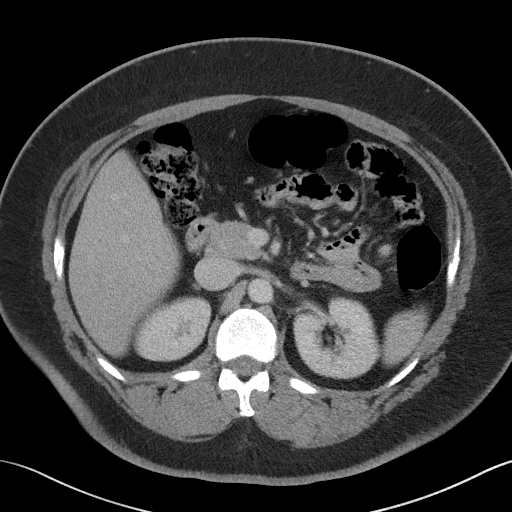
[im 82/103  soft-tissue]
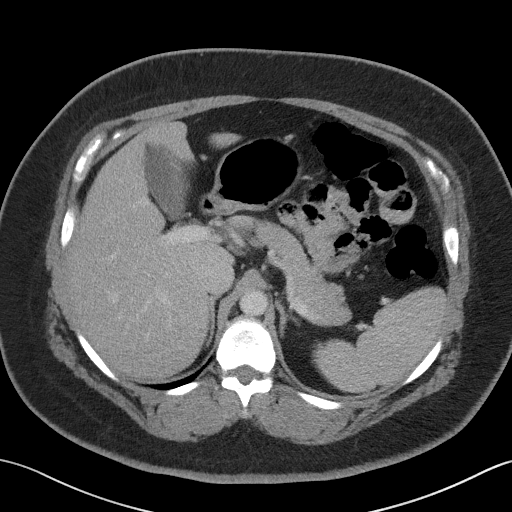
[im 90/103  soft-tissue]
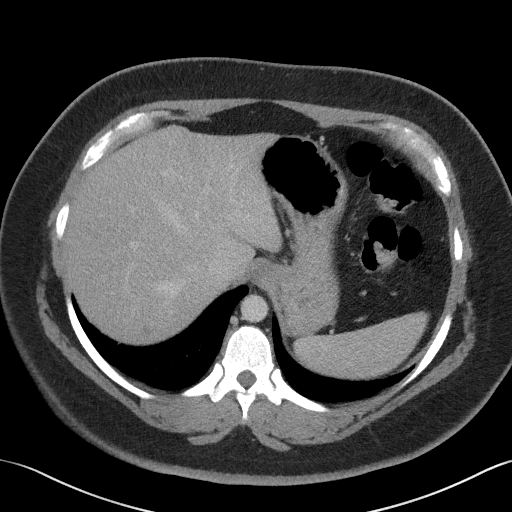
[im 98/103  soft-tissue]
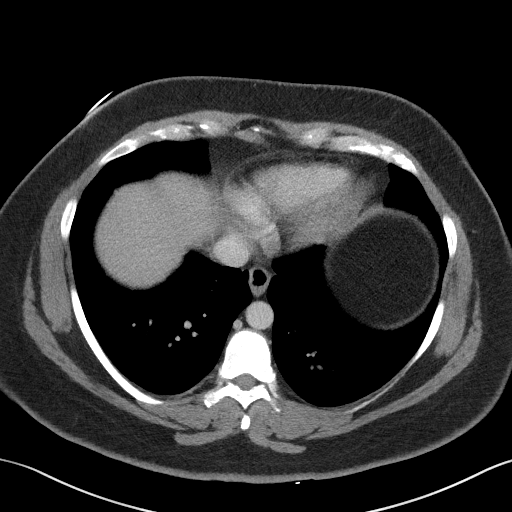

[Series 5: coronal st · coronal · 0.78mm/px · 3 of 115 slices shown]
[im 39/115  soft-tissue]
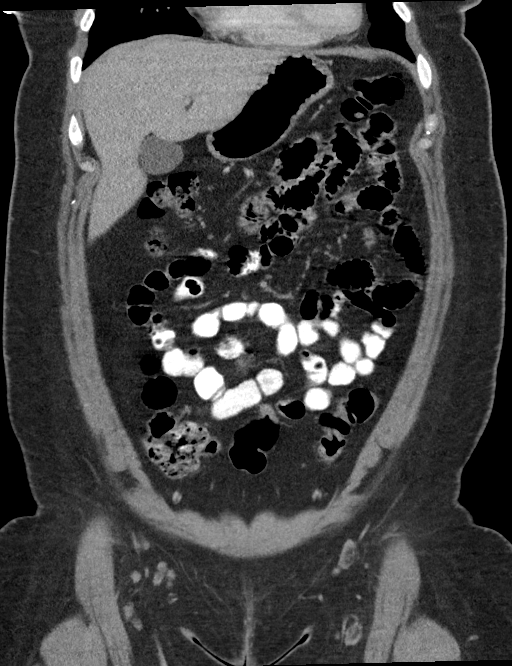
[im 51/115  soft-tissue]
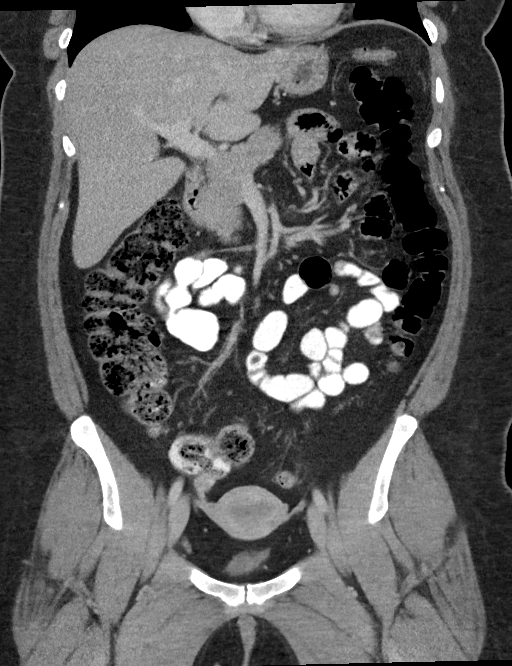
[im 64/115  soft-tissue]
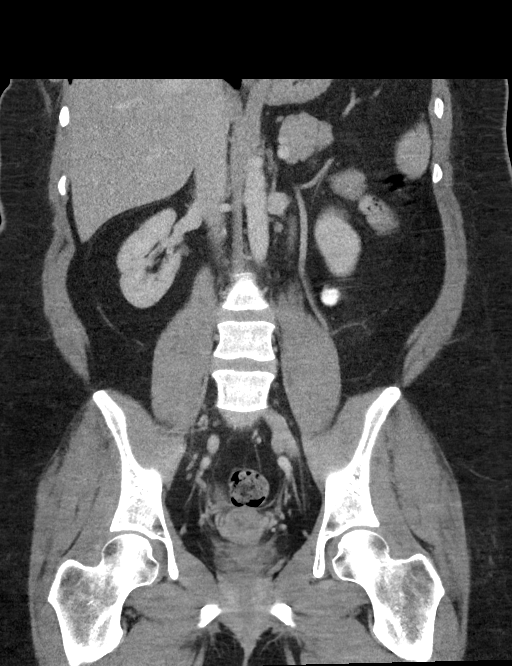

[16 of 46 positions shown; findings below may reference images not displayed]

FINDINGS: Lower chest: Minimal basilar atelectasis/scarring. Heart size within
normal limits.

Hepatobiliary: Enlarged fatty liver spanning over 18.9 cm. 7 mm
low-density structure posterior aspect of the right lobe liver too
small to adequately characterize.

No calcified gallstones.

Pancreas: No worrisome mass or inflammation.

Spleen: No mass or enlargement.

Adrenals/Urinary Tract: No obstructing stone or hydronephrosis. No
worrisome renal or adrenal mass.

Contracted urinary bladder with wall thickening incompletely
assessed.

Stomach/Bowel: No extraluminal bowel inflammatory process or
worrisome mass identified.

Vascular/Lymphatic: No aortic aneurysm or large vessel occlusion.

Slightly prominent size right external iliac lymph node with short
axis dimension of 1.1 cm.

Reproductive: Suspect several small ovarian follicles bilaterally
without dominant worrisome mass.

Other: No bowel containing hernia or free air.

Musculoskeletal: Bilateral sacroiliac joint degenerative changes
with partial fusion.
IMPRESSION: Enlarged fatty liver spanning over 18.9 cm. 7 mm low-density
structure posterior aspect of the right lobe of the liver too small
to adequately characterize.

Slightly prominent size right external iliac lymph node with short
axis dimension 1.1 cm of questionable significance/etiology.

Suspect several small ovarian follicles bilaterally without
worrisome dominant ovarian lesion.

Bilateral sacroiliac joint degenerative changes with partial fusion.

## 2019-07-14 ENCOUNTER — Other Ambulatory Visit: Payer: Self-pay

## 2019-07-14 ENCOUNTER — Encounter: Payer: Self-pay | Admitting: Advanced Practice Midwife

## 2019-07-14 ENCOUNTER — Ambulatory Visit (INDEPENDENT_AMBULATORY_CARE_PROVIDER_SITE_OTHER): Payer: No Typology Code available for payment source | Admitting: Advanced Practice Midwife

## 2019-07-14 VITALS — BP 128/84 | Ht 68.0 in | Wt 296.0 lb

## 2019-07-14 DIAGNOSIS — Z01419 Encounter for gynecological examination (general) (routine) without abnormal findings: Secondary | ICD-10-CM

## 2019-07-14 DIAGNOSIS — Z0000 Encounter for general adult medical examination without abnormal findings: Secondary | ICD-10-CM

## 2019-07-14 DIAGNOSIS — Z1239 Encounter for other screening for malignant neoplasm of breast: Secondary | ICD-10-CM

## 2019-07-14 NOTE — Progress Notes (Signed)
Gynecology Annual Exam  PCP: Glori Luis, MD  Chief Complaint:  Chief Complaint  Patient presents with  . Annual Exam    History of Present Illness: Patient is a 39 y.o. G0P0000 presents for annual exam. The patient has no gyn complaints today. She is contemplating conception within the next year. She is aware of decreasing fertility and slight increased risk of chromosomal errors with advanced maternal age. She knows that losing some weight could help increase her fertility. 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   LMP: Patient's last menstrual period was 06/13/2019. Average Interval: irregular, every 2-3 months Duration of flow: 7-11 days Heavy Menses: yes Clots: no Intermenstrual Bleeding: no Postcoital Bleeding: no Dysmenorrhea: no   The patient is not currently sexually active. She currently uses condoms for contraception. She denies dyspareunia.  The patient does not perform self breast exams.  There is notable family history of breast or ovarian cancer in her family. There is history of ovarian cancer on her paternal side. She declines MyRisk testing at this time.   The patient wears seatbelts: yes.   The patient has regular exercise: she has resumed pool workout. She uses weight watchers with some success. She drinks some water and continues to have diet coke daily. Sleep is not ideal due to shift work.    The patient denies current symptoms of depression.    Review of Systems: Review of Systems  Constitutional: Negative.   HENT: Negative.   Eyes: Negative.   Respiratory: Negative.   Cardiovascular: Negative.   Gastrointestinal: Negative.   Genitourinary: Negative.   Musculoskeletal: Negative.   Skin: Negative.   Neurological: Positive for dizziness.  Endo/Heme/Allergies: Negative.   Psychiatric/Behavioral: Negative.     Past Medical History:  Past Medical History:    Diagnosis Date  . Chicken pox   . Depression     Past Surgical History:  Past Surgical History:  Procedure Laterality Date  . ANKLE ARTHROSCOPY WITH OPEN REDUCTION INTERNAL FIXATION (ORIF)  07/2005   Hardware removal 07/2006    Gynecologic History:  Patient's last menstrual period was 06/13/2019. Contraception: condoms Last Pap: 1.5 years ago Results were:  no abnormalities  Last mammogram: first mammogram ordered today  Obstetric History: G0P0000  Family History:  Family History  Problem Relation Age of Onset  . Hypertension Mother   . Diabetes Father   . Hyperlipidemia Father   . Heart disease Father        Father became septic and subsequently had a STEMI  . Stroke Maternal Grandmother   . Heart disease Maternal Grandmother   . Cancer Maternal Grandfather   . Ovarian cancer Paternal Grandmother   . Heart disease Paternal Grandfather   . Diabetes Paternal Grandfather   . Drug abuse Brother   . Ovarian cancer Paternal Aunt   . Breast cancer Paternal Aunt   . Breast cancer Paternal Aunt     Social History:  Social History   Socioeconomic History  . Marital status: Single    Spouse name: Not on file  . Number of children: Not on file  . Years of education: Not on file  . Highest education level: Not on file  Occupational History  . Not on file  Tobacco Use  . Smoking status: Never Smoker  . Smokeless tobacco: Never Used  Substance and Sexual Activity  . Alcohol use: No  Alcohol/week: 0.0 standard drinks  . Drug use: No  . Sexual activity: Yes    Birth control/protection: None  Other Topics Concern  . Not on file  Social History Narrative  . Not on file   Social Determinants of Health   Financial Resource Strain:   . Difficulty of Paying Living Expenses: Not on file  Food Insecurity:   . Worried About Charity fundraiser in the Last Year: Not on file  . Ran Out of Food in the Last Year: Not on file  Transportation Needs:   . Lack of  Transportation (Medical): Not on file  . Lack of Transportation (Non-Medical): Not on file  Physical Activity:   . Days of Exercise per Week: Not on file  . Minutes of Exercise per Session: Not on file  Stress:   . Feeling of Stress : Not on file  Social Connections:   . Frequency of Communication with Friends and Family: Not on file  . Frequency of Social Gatherings with Friends and Family: Not on file  . Attends Religious Services: Not on file  . Active Member of Clubs or Organizations: Not on file  . Attends Archivist Meetings: Not on file  . Marital Status: Not on file  Intimate Partner Violence:   . Fear of Current or Ex-Partner: Not on file  . Emotionally Abused: Not on file  . Physically Abused: Not on file  . Sexually Abused: Not on file    Allergies:  Allergies  Allergen Reactions  . Morphine And Related     Anaphylaxis  . Penicillins     Anaphylaxis     Medications: Prior to Admission medications   Medication Sig Start Date End Date Taking? Authorizing Provider  doxylamine, Sleep, (UNISOM) 25 MG tablet Take 25 mg by mouth at bedtime as needed.   Yes [provider]  UNABLE TO FIND Med Name:Alteril   Yes [provider]    Physical Exam Vitals: Blood pressure 128/84, height 5\' 8"  (1.727 m), weight 296 lb (134.3 kg), last menstrual period 06/13/2019.  General: NAD HEENT: normocephalic, anicteric Thyroid: no enlargement, no palpable nodules Pulmonary: No increased work of breathing, CTAB Cardiovascular: RRR, distal pulses 2+ Breast: Breast symmetrical, no tenderness, no palpable nodules or masses, no skin or nipple retraction present, no nipple discharge.  No axillary or supraclavicular lymphadenopathy. Abdomen: NABS, soft, non-tender, non-distended.  Umbilicus without lesions.  No hepatomegaly, splenomegaly or masses palpable. No evidence of hernia  Genitourinary: deferred for no concerns/PAP interval Extremities: no edema,  erythema, or tenderness Neurologic: Grossly intact Psychiatric: mood appropriate, affect full    Assessment: 39 y.o. G0P0000 routine annual exam  Plan: Problem List Items Addressed This Visit    None    Visit Diagnoses    Well woman exam without gynecological exam    -  Primary   Relevant Orders   MM DIGITAL SCREENING BILATERAL   Encounter for screening for malignant neoplasm of breast, unspecified screening modality       Relevant Orders   MM DIGITAL SCREENING BILATERAL      1) Mammogram - recommend yearly screening mammogram.  Mammogram Was ordered today   2) STI screening  was offered and declined  3) ASCCP guidelines and rationale discussed.  Patient opts for every 3 years screening interval  4) Contraception - the patient is currently using  condoms.  She is happy with her current form of contraception and plans to continue  5) Colonoscopy -- Screening recommended  starting at age 62 for average risk individuals, age 63 for individuals deemed at increased risk (including African Americans) and recommended to continue until age 30.  For patient age 58-85 individualized approach is recommended.  Gold standard screening is via colonoscopy, Cologuard screening is an acceptable alternative for patient unwilling or unable to undergo colonoscopy.  "Colorectal cancer screening for average?risk adults: 2018 guideline update from the American Cancer Society"CA: A Cancer Journal for Clinicians: Nov 12, 2016   6) Routine healthcare maintenance including cholesterol, diabetes screening discussed Declines  7) Return in about 1 year (around 07/13/2020) for annual established gyn.   Tresea Mall, CNM [facility] 07/14/2019, 9:19 AM

## 2019-07-18 ENCOUNTER — Encounter: Payer: No Typology Code available for payment source | Admitting: Family Medicine

## 2019-11-10 ENCOUNTER — Ambulatory Visit: Payer: No Typology Code available for payment source | Admitting: Dermatology

## 2020-02-08 ENCOUNTER — Ambulatory Visit: Payer: No Typology Code available for payment source | Admitting: Family Medicine

## 2020-02-15 ENCOUNTER — Telehealth: Payer: Self-pay | Admitting: Family Medicine

## 2020-02-15 ENCOUNTER — Ambulatory Visit (INDEPENDENT_AMBULATORY_CARE_PROVIDER_SITE_OTHER): Payer: No Typology Code available for payment source | Admitting: Dermatology

## 2020-02-15 ENCOUNTER — Other Ambulatory Visit: Payer: Self-pay

## 2020-02-15 DIAGNOSIS — L918 Other hypertrophic disorders of the skin: Secondary | ICD-10-CM

## 2020-02-15 DIAGNOSIS — L820 Inflamed seborrheic keratosis: Secondary | ICD-10-CM | POA: Diagnosis not present

## 2020-02-15 DIAGNOSIS — L578 Other skin changes due to chronic exposure to nonionizing radiation: Secondary | ICD-10-CM | POA: Diagnosis not present

## 2020-02-15 NOTE — Addendum Note (Signed)
Addended by: Birdie Sons Zonnique Norkus G on: 02/15/2020 01:49 PM   Modules accepted: Orders

## 2020-02-15 NOTE — Telephone Encounter (Signed)
Referral placed. Message sent to Rasheedah to send the referral for the patient.

## 2020-02-15 NOTE — Patient Instructions (Addendum)

## 2020-02-15 NOTE — Telephone Encounter (Signed)
Pt called and needs a referral sent to [facility] she had skin tag removed today but do to her having [facility] they need another referral placed for the removing of the skin tags or she would have to pay out of pocket.  Shreeya Recendiz,cma

## 2020-02-15 NOTE — Telephone Encounter (Signed)
Pt needs referral done today or she will have to pay out of pocket for the dermatologist removing the skin tags. Please send patient a message once the referral is placed so she can put it into the system for [facility].

## 2020-02-15 NOTE — Progress Notes (Signed)
Follow-Up Visit   Subjective  Bridget Hull is a 39 y.o. female who presents for the following: Skin Tag (neck, inframammary, groin, > 48yr, irritated,).  The following portions of the chart were reviewed this encounter and updated as appropriate:  Tobacco  Allergies  Meds  Problems  Med Hx  Surg Hx  Fam Hx     Review of Systems:  No other skin or systemic complaints except as noted in HPI or Assessment and Plan.  Objective  Well appearing patient in no apparent distress; mood and affect are within normal limits.  A focused examination was performed including neck, trunk, groin. Relevant physical exam findings are noted in the Assessment and Plan.  Objective  Neck, inframammary, axilla, abdomen (25): Fleshy, skin-colored pedunculated papules.    Objective  neck x 20 (20): Erythematous keratotic or waxy stuck-on papule or plaque.    Assessment & Plan  Skin tag Neck, inframammary, axilla, abdomen Snip excision x 10 L neck x 3, R neck x 2, R inframammary x 1, RLQA x 1, R axilla x 1, R lat breast x 2 Ln2 x 15 Neck x 15 Total treated x 25  Acrochordons (Skin Tags) - Removal desired by patient - Fleshy, skin-colored pedunculated papules - Benign appearing.  - Patient desires removal. Reviewed that this is not covered by insurance and they will be charged a cosmetic fee for removal. Patient signed non-covered consent.  - Prior to the procedure, reviewed the expected small wound. Also reviewed the risk of leaving a small scar and the small risk of infection.  PROCEDURE - The areas were prepped with isopropyl alcohol. A small amount of lidocaine 1% with epinephrine was injected at the base of each lesion to achieve good local anesthesia. The skin tags were removed using a snip technique. Aluminum chloride was used for hemostasis. Petrolatum and a bandage were applied. The procedure was tolerated well. - Wound care was reviewed with the patient. They were advised to call with  any concerns. Total number of treated acrochordons 10   Acrochordons (Skin Tags) - Removal desired by patient - Fleshy, skin-colored pedunculated papules - Benign appearing.  - Patient desires removal. Reviewed that this is not covered by insurance and they will be charged a cosmetic fee for removal. Patient signed non-covered consent.  - Prior to procedure, discussed risks of blister formation, small wound, skin dyspigmentation, or rare scar following cryotherapy.  PROCEDURE - Cryotherapy was performed to affected areas. The procedure was tolerated well. Wound care was reviewed with the patient. They were advised to call with any concerns. Total number of treated acrochordons 15   Inflamed seborrheic keratosis (20) neck x 20  Destruction of lesion - neck x 20 Complexity: simple   Destruction method: cryotherapy   Informed consent: discussed and consent obtained   Timeout:  patient name, date of birth, surgical site, and procedure verified Lesion destroyed using liquid nitrogen: Yes   Region frozen until ice ball extended beyond lesion: Yes   Outcome: patient tolerated procedure well with no complications   Post-procedure details: wound care instructions given     Seborrheic Keratoses - Stuck-on, waxy, tan-brown papules and plaques  - Discussed benign etiology and prognosis. - Observe - Call for any changes  Return if symptoms worsen or fail to improve.   I, Ardis Rowan, RMA, am acting as scribe for Armida Sans, MD .  Documentation: I have reviewed the above documentation for accuracy and completeness, and I agree with the above.  Sarina Ser, MD

## 2020-02-15 NOTE — Telephone Encounter (Signed)
Pt called and needs a referral sent to [facility] she had skin tag removed today but do to her having [facility] they need another referral placed for the removing of the skin tags

## 2020-02-20 ENCOUNTER — Encounter: Payer: Self-pay | Admitting: Dermatology

## 2020-02-22 ENCOUNTER — Ambulatory Visit: Payer: No Typology Code available for payment source | Admitting: Family Medicine

## 2020-04-17 ENCOUNTER — Other Ambulatory Visit: Payer: Self-pay

## 2020-04-17 ENCOUNTER — Telehealth: Payer: Self-pay | Admitting: Family Medicine

## 2020-04-17 ENCOUNTER — Ambulatory Visit (INDEPENDENT_AMBULATORY_CARE_PROVIDER_SITE_OTHER): Payer: No Typology Code available for payment source | Admitting: Family Medicine

## 2020-04-17 ENCOUNTER — Encounter: Payer: Self-pay | Admitting: Family Medicine

## 2020-04-17 DIAGNOSIS — I10 Essential (primary) hypertension: Secondary | ICD-10-CM | POA: Diagnosis not present

## 2020-04-17 DIAGNOSIS — E282 Polycystic ovarian syndrome: Secondary | ICD-10-CM | POA: Insufficient documentation

## 2020-04-17 DIAGNOSIS — N926 Irregular menstruation, unspecified: Secondary | ICD-10-CM | POA: Diagnosis not present

## 2020-04-17 DIAGNOSIS — R42 Dizziness and giddiness: Secondary | ICD-10-CM

## 2020-04-17 DIAGNOSIS — R809 Proteinuria, unspecified: Secondary | ICD-10-CM

## 2020-04-17 LAB — COMPREHENSIVE METABOLIC PANEL
ALT: 20 U/L (ref 0–35)
AST: 19 U/L (ref 0–37)
Albumin: 4.2 g/dL (ref 3.5–5.2)
Alkaline Phosphatase: 59 U/L (ref 39–117)
BUN: 10 mg/dL (ref 6–23)
CO2: 29 mEq/L (ref 19–32)
Calcium: 9.5 mg/dL (ref 8.4–10.5)
Chloride: 102 mEq/L (ref 96–112)
Creatinine, Ser: 0.73 mg/dL (ref 0.40–1.20)
GFR: 102.04 mL/min (ref >60.00)
Glucose, Bld: 119 mg/dL — ABNORMAL HIGH (ref 70–99)
Potassium: 4.1 mEq/L (ref 3.5–5.1)
Sodium: 138 mEq/L (ref 135–145)
Total Bilirubin: 0.6 mg/dL (ref 0.2–1.2)
Total Protein: 6.7 g/dL (ref 6.0–8.3)

## 2020-04-17 LAB — POCT URINALYSIS DIPSTICK
Bilirubin, UA: NEGATIVE
Blood, UA: NEGATIVE
Glucose, UA: NEGATIVE
Ketones, UA: NEGATIVE
Leukocytes, UA: NEGATIVE
Nitrite, UA: NEGATIVE
Protein, UA: POSITIVE — AB
Spec Grav, UA: >=1.03 — AB (ref 1.010–1.025)
Urobilinogen, UA: 1 E.U./dL
pH, UA: 6 (ref 5.0–8.0)

## 2020-04-17 LAB — CBC
HCT: 40.9 % (ref 36.0–46.0)
Hemoglobin: 13.8 g/dL (ref 12.0–15.0)
MCHC: 33.7 g/dL (ref 30.0–36.0)
MCV: 85.4 fl (ref 78.0–100.0)
Platelets: 334 10*3/uL (ref 150.0–400.0)
RBC: 4.79 Mil/uL (ref 3.87–5.11)
RDW: 14.2 % (ref 11.5–15.5)
WBC: 6.3 10*3/uL (ref 4.0–10.5)

## 2020-04-17 LAB — LIPID PANEL
Cholesterol: 193 mg/dL (ref 0–200)
HDL: 47.4 mg/dL (ref >39.00)
LDL Cholesterol: 110 mg/dL — ABNORMAL HIGH (ref 0–99)
NonHDL: 145.16
Total CHOL/HDL Ratio: 4
Triglycerides: 176 mg/dL — ABNORMAL HIGH (ref 0.0–149.0)
VLDL: 35.2 mg/dL (ref 0.0–40.0)

## 2020-04-17 LAB — POCT URINE PREGNANCY: Preg Test, Ur: NEGATIVE

## 2020-04-17 LAB — TSH: TSH: 0.87 u[IU]/mL (ref 0.35–4.50)

## 2020-04-17 LAB — HEMOGLOBIN A1C: Hgb A1c MFr Bld: 6.2 % (ref 4.6–6.5)

## 2020-04-17 MED ORDER — HYDROCHLOROTHIAZIDE 12.5 MG PO TABS: 12.5000 mg | ORAL_TABLET | Freq: Every day | ORAL | 3 refills | Status: DC

## 2020-04-17 MED ORDER — METFORMIN HCL 500 MG PO TABS: 500.0000 mg | ORAL_TABLET | Freq: Two times a day (BID) | ORAL | 3 refills | Status: DC

## 2020-04-17 NOTE — Assessment & Plan Note (Signed)
Previously seen by ENT.  Patient notes her insurance would not cover some of the testing that they wanted to do.  She did respond quite well to Dyazide.  We will start with HCTZ 12.5 mg once daily for her blood pressure and see if that is beneficial for her vertigo.  If it is not beneficial for this or her blood pressure we could consider switching to Dyazide at her next visit.

## 2020-04-17 NOTE — Assessment & Plan Note (Signed)
Patient with persistently elevated blood pressures at home meeting criteria for hypertension.  She does have a strong family history and has had weight gain recently and thus this likely represents essential hypertension.  We will start her on HCTZ as she had good response with her vertigo to Dyazide.  If needed we could expand to Dyazide at recheck.  Lab work as outlined.  She will return in 3 weeks and will follow up with a BMP and BP check at that time.

## 2020-04-17 NOTE — Telephone Encounter (Signed)
Patient was returning call for results 

## 2020-04-17 NOTE — Progress Notes (Signed)
Tommi Rumps, MD Phone: [phone]  Bridget Hull is a 39 y.o. female who presents today for f/u.  Hypertension: Patient notes multiple blood pressures at home that have been greater than 130/80.  Typically running 130s-140s/90s-low 100s.  Occasionally up into the 160s over 100s.  No chest pain or shortness of breath.  She follows a low-sodium diet.  Eats lots of salads with oil and lemon juice.  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  Does drink quite a bit of diet Coke.  Not enough water.  She is swimming 3 to 4 miles a day.  She notes her mom and her dad both had hypertension that developed around her age.  PCOS: Patient notes a history of this.  Has irregular menstrual cycles.  Last menses was in August.  She wonders if this is contributing to her weight gain and her blood pressure.  Vertigo: Patient was previously on Dyazide through ENT and notes this was quite beneficial.  She wonders about going back on this.  Reports she is having about 1 vertiginous episode per week.  Typically occurs after she is in the pool and does a turn at the wall.  Social History   Tobacco Use  Smoking Status Never Smoker  Smokeless Tobacco Never Used     ROS see history of present illness  Objective  Physical Exam Vitals:   04/17/20 0811  BP: 140/90  Pulse: 98  Temp: 98.8 F (37.1 C)  SpO2: 97%    BP Readings from Last 3 Encounters:  04/17/20 140/90  07/14/19 128/84  07/04/15 128/82   Wt Readings from Last 3 Encounters:  04/17/20 (!) 303 lb (137.4 kg)  07/14/19 296 lb (134.3 kg)  07/04/15 268 lb 12.8 oz (121.9 kg)    Physical Exam Constitutional:      General: She is not in acute distress.    Appearance: She is not diaphoretic.  Cardiovascular:     Rate and Rhythm: Normal rate and regular rhythm.     Heart sounds: Normal heart sounds.  Pulmonary:     Effort: Pulmonary effort is normal.     Breath sounds: Normal breath sounds.   Musculoskeletal:     Right lower leg: No edema.     Left lower leg: No edema.  Skin:    General: Skin is warm and dry.  Neurological:     Mental Status: She is alert.      Assessment/Plan: Please see individual problem list.  Problem List Items Addressed This Visit    Hypertension    Patient with persistently elevated blood pressures at home meeting criteria for hypertension.  She does have a strong family history and has had weight gain recently and thus this likely represents essential hypertension.  We will start her on HCTZ as she had good response with her vertigo to Dyazide.  If needed we could expand to Dyazide at recheck.  Lab work as outlined.  She will return in 3 weeks and will follow up with a BMP and BP check at that time.      Relevant Medications   hydrochlorothiazide (HYDRODIURIL) 12.5 MG tablet   Other Relevant Orders   Comp Met (CMET)   TSH   CBC   Lipid panel   POCT Urinalysis Dipstick (Completed)   Irregular menses    Likely related to PCOS.  We will check a urine pregnancy test to rule out pregnancy.      Relevant Orders  POCT urine pregnancy (Completed)   Morbid obesity ([place]) - Primary    Patient has done a great job of diet and exercise and has not been able to lose any weight.  Her PCOS is likely contributing.  We opted for Metformin to see if that would help with weight loss given her PCOS.  If it is not helpful we could consider wegovy.      Relevant Medications   metFORMIN (GLUCOPHAGE) 500 MG tablet   Other Relevant Orders   Lipid panel   HgB A1c   PCOS (polycystic ovarian syndrome)    Patient reports history of this.  This is likely contributing to her difficulty in losing weight and potentially her blood pressure issues.  We did discuss weight loss options and given history of PCOS opted for Metformin to see if that would make a difference for her weight.  This may also help with her irregular menstrual cycles.  Plan on rechecking in 3  weeks.      Relevant Medications   metFORMIN (GLUCOPHAGE) 500 MG tablet   Other Relevant Orders   HgB A1c   Vertigo    Previously seen by ENT.  Patient notes her insurance would not cover some of the testing that they wanted to do.  She did respond quite well to Dyazide.  We will start with HCTZ 12.5 mg once daily for her blood pressure and see if that is beneficial for her vertigo.  If it is not beneficial for this or her blood pressure we could consider switching to Dyazide at her next visit.         This visit occurred during the SARS-CoV-2 public health emergency.  Safety protocols were in place, including screening questions prior to the visit, additional usage of staff PPE, and extensive cleaning of exam room while observing appropriate contact time as indicated for disinfecting solutions.    Tommi Rumps, MD [place]

## 2020-04-17 NOTE — Assessment & Plan Note (Signed)
Patient reports history of this.  This is likely contributing to her difficulty in losing weight and potentially her blood pressure issues.  We did discuss weight loss options and given history of PCOS opted for Metformin to see if that would make a difference for her weight.  This may also help with her irregular menstrual cycles.  Plan on rechecking in 3 weeks.

## 2020-04-17 NOTE — Assessment & Plan Note (Signed)
Patient has done a great job of diet and exercise and has not been able to lose any weight.  Her PCOS is likely contributing.  We opted for Metformin to see if that would help with weight loss given her PCOS.  If it is not helpful we could consider wegovy.

## 2020-04-17 NOTE — Patient Instructions (Signed)
Nice to see you. We will start you on Metformin and see if that helps with her weight loss. We will start you on HCTZ for your blood pressure. I will see you back in 3 weeks for recheck.

## 2020-04-17 NOTE — Assessment & Plan Note (Signed)
Likely related to PCOS.  We will check a urine pregnancy test to rule out pregnancy.

## 2020-04-18 ENCOUNTER — Other Ambulatory Visit: Payer: Self-pay | Admitting: Family Medicine

## 2020-04-18 DIAGNOSIS — R809 Proteinuria, unspecified: Secondary | ICD-10-CM

## 2020-04-18 NOTE — Telephone Encounter (Signed)
I called and spoke with the patient and sent a response to the provider.  Milina Pagett,cma

## 2020-04-18 NOTE — Addendum Note (Signed)
Addended by: Bonnell Public I on: 04/18/2020 08:40 AM   Modules accepted: Orders

## 2020-04-18 NOTE — Progress Notes (Signed)
Got it! Lab will be sent off on the next pickup

## 2020-04-19 LAB — PROTEIN / CREATININE RATIO, URINE
Creatinine, Urine: 200 mg/dL (ref 20–275)
Protein/Creat Ratio: 50 mg/g creat (ref 21–161)
Protein/Creatinine Ratio: 0.05 mg/mg creat (ref 0.021–0.16)
Total Protein, Urine: 10 mg/dL (ref 5–24)

## 2020-05-17 ENCOUNTER — Ambulatory Visit: Payer: No Typology Code available for payment source | Admitting: Obstetrics and Gynecology

## 2020-05-17 NOTE — Progress Notes (Deleted)
Glori Luis, MD   No chief complaint on file.   HPI:      Ms. Bridget Hull is a 39 y.o. G0P0000 whose LMP was No LMP recorded. (Menstrual status: Irregular Periods)., presents today for ***    Past Medical History:  Diagnosis Date  . Chicken pox   . Depression     Past Surgical History:  Procedure Laterality Date  . ANKLE ARTHROSCOPY WITH OPEN REDUCTION INTERNAL FIXATION (ORIF)  07/2005   Hardware removal 07/2006    Family History  Problem Relation Age of Onset  . Hypertension Mother   . Diabetes Father   . Hyperlipidemia Father   . Heart disease Father        Father became septic and subsequently had a STEMI  . Stroke Maternal Grandmother   . Heart disease Maternal Grandmother   . Cancer Maternal Grandfather   . Ovarian cancer Paternal Grandmother   . Heart disease Paternal Grandfather   . Diabetes Paternal Grandfather   . Drug abuse Brother   . Ovarian cancer Paternal Aunt   . Breast cancer Paternal Aunt   . Breast cancer Paternal Aunt     Social History   Socioeconomic History  . Marital status: Single    Spouse name: Not on file  . Number of children: Not on file  . Years of education: Not on file  . Highest education level: Not on file  Occupational History  . Not on file  Tobacco Use  . Smoking status: Never Smoker  . Smokeless tobacco: Never Used  Vaping Use  . Vaping Use: Never used  Substance and Sexual Activity  . Alcohol use: No    Alcohol/week: 0.0 standard drinks  . Drug use: No  . Sexual activity: Yes    Birth control/protection: None  Other Topics Concern  . Not on file  Social History Narrative  . Not on file   Social Determinants of Health   Financial Resource Strain:   . Difficulty of Paying Living Expenses: Not on file  Food Insecurity:   . Worried About Programme researcher, broadcasting/film/video in the Last Year: Not on file  . Ran Out of Food in the Last Year: Not on file  Transportation Needs:   . Lack of Transportation  (Medical): Not on file  . Lack of Transportation (Non-Medical): Not on file  Physical Activity:   . Days of Exercise per Week: Not on file  . Minutes of Exercise per Session: Not on file  Stress:   . Feeling of Stress : Not on file  Social Connections:   . Frequency of Communication with Friends and Family: Not on file  . Frequency of Social Gatherings with Friends and Family: Not on file  . Attends Religious Services: Not on file  . Active Member of Clubs or Organizations: Not on file  . Attends Banker Meetings: Not on file  . Marital Status: Not on file  Intimate Partner Violence:   . Fear of Current or Ex-Partner: Not on file  . Emotionally Abused: Not on file  . Physically Abused: Not on file  . Sexually Abused: Not on file    Outpatient Medications Prior to Visit  Medication Sig Dispense Refill  . doxylamine, Sleep, (UNISOM) 25 MG tablet Take 25 mg by mouth at bedtime as needed.    . hydrochlorothiazide (HYDRODIURIL) 12.5 MG tablet Take 1 tablet (12.5 mg total) by mouth daily. 30 tablet 3  . metFORMIN (GLUCOPHAGE) 500 MG  tablet Take 1 tablet (500 mg total) by mouth 2 (two) times daily with a meal. 180 tablet 3  . UNABLE TO FIND Med Name:Alteril     No facility-administered medications prior to visit.      ROS:  Review of Systems BREAST: No symptoms   OBJECTIVE:   Vitals:  There were no vitals taken for this visit.  Physical Exam  Results: No results found for this or any previous visit (from the past 24 hour(s)).   Assessment/Plan: No diagnosis found.    No orders of the defined types were placed in this encounter.     No follow-ups on file.  Alando Colleran B. Gergory Biello, PA-C 05/17/2020 9:22 AM

## 2020-05-23 ENCOUNTER — Ambulatory Visit: Payer: No Typology Code available for payment source | Admitting: Family Medicine

## 2020-07-13 ENCOUNTER — Encounter: Payer: Self-pay | Admitting: *Deleted

## 2020-10-09 ENCOUNTER — Ambulatory Visit: Payer: No Typology Code available for payment source | Admitting: Obstetrics and Gynecology

## 2020-10-18 ENCOUNTER — Ambulatory Visit: Payer: No Typology Code available for payment source | Admitting: Obstetrics and Gynecology

## 2021-02-13 ENCOUNTER — Other Ambulatory Visit: Payer: Self-pay

## 2021-02-13 MED ORDER — CARESTART COVID-19 HOME TEST VI KIT
PACK | 0 refills | Status: DC
  Filled 2021-02-13: qty 2, 4d supply, fill #0

## 2021-02-27 ENCOUNTER — Other Ambulatory Visit: Payer: Self-pay

## 2021-02-27 DIAGNOSIS — I10 Essential (primary) hypertension: Secondary | ICD-10-CM

## 2021-02-27 MED ORDER — HYDROCHLOROTHIAZIDE 12.5 MG PO TABS
12.5000 mg | ORAL_TABLET | Freq: Every day | ORAL | 0 refills | Status: DC
  Filled 2021-03-04: qty 90, 90d supply, fill #0

## 2021-03-04 ENCOUNTER — Other Ambulatory Visit: Payer: Self-pay

## 2021-04-15 ENCOUNTER — Encounter: Payer: No Typology Code available for payment source | Admitting: Family Medicine

## 2021-05-27 ENCOUNTER — Other Ambulatory Visit: Payer: Self-pay

## 2021-05-27 ENCOUNTER — Ambulatory Visit (INDEPENDENT_AMBULATORY_CARE_PROVIDER_SITE_OTHER): Payer: No Typology Code available for payment source | Admitting: Family Medicine

## 2021-05-27 ENCOUNTER — Encounter: Payer: Self-pay | Admitting: Family Medicine

## 2021-05-27 VITALS — BP 130/90 | HR 78 | Temp 98.6°F | Ht 69.0 in | Wt 307.6 lb

## 2021-05-27 DIAGNOSIS — Z130 Encounter for screening for diseases of the blood and blood-forming organs and certain disorders involving the immune mechanism: Secondary | ICD-10-CM | POA: Diagnosis not present

## 2021-05-27 DIAGNOSIS — Z0001 Encounter for general adult medical examination with abnormal findings: Secondary | ICD-10-CM | POA: Diagnosis not present

## 2021-05-27 DIAGNOSIS — I10 Essential (primary) hypertension: Secondary | ICD-10-CM

## 2021-05-27 DIAGNOSIS — Z1329 Encounter for screening for other suspected endocrine disorder: Secondary | ICD-10-CM | POA: Diagnosis not present

## 2021-05-27 DIAGNOSIS — Z131 Encounter for screening for diabetes mellitus: Secondary | ICD-10-CM

## 2021-05-27 MED ORDER — HYDROCHLOROTHIAZIDE 12.5 MG PO TABS
12.5000 mg | ORAL_TABLET | Freq: Every day | ORAL | 3 refills | Status: DC
  Filled 2021-05-27: qty 90, 90d supply, fill #0
  Filled 2021-08-26: qty 90, 90d supply, fill #1
  Filled 2021-09-23: qty 60, 60d supply, fill #2

## 2021-05-27 MED ORDER — HYDROCHLOROTHIAZIDE 12.5 MG PO TABS: 12.5000 mg | ORAL_TABLET | Freq: Every day | ORAL | 3 refills | Status: DC

## 2021-05-27 MED ORDER — AMLODIPINE BESYLATE 5 MG PO TABS
5.0000 mg | ORAL_TABLET | Freq: Every day | ORAL | 3 refills | Status: DC
  Filled 2021-05-27 – 2021-06-03 (×3): qty 90, 90d supply, fill #0
  Filled 2021-08-26: qty 90, 90d supply, fill #1
  Filled 2021-11-26: qty 90, 90d supply, fill #2
  Filled 2022-03-11: qty 90, 90d supply, fill #3

## 2021-05-27 MED ORDER — AMLODIPINE BESYLATE 5 MG PO TABS: 5.0000 mg | ORAL_TABLET | Freq: Every day | ORAL | 3 refills | Status: DC

## 2021-05-27 NOTE — Assessment & Plan Note (Signed)
Uncontrolled.  We will add amlodipine 5 mg once daily.  She will continue HCTZ 12.5 mg daily.  Lab work as outlined.

## 2021-05-27 NOTE — Progress Notes (Signed)
Tommi Rumps, MD Phone: [phone]  Bridget Hull is a 40 y.o. female who presents today for CPE.  Diet: May be slightly more sodium than she should, generally eats fresh at home with lean meats and fish.  Sometimes has a snacks for dinner. Exercise: Not swimming as much as she was though does still swim some.  Gets on the elliptical. Pap smear: 01/20/2018 NILM neg HPV Mammogram: due Family history-  Colon cancer: no  Breast cancer: paternal aunts  Ovarian cancer: paternal grandmother  Thyroid cancer: Mother Vaccines-   Flu: Up-to-date  Tetanus: Up-to-date  COVID19: Up-to-date HIV screening: Up-to-date Hep C Screening: Up-to-date - reports this was done back in 2013 or so by GYN Tobacco use: no Alcohol use: no Illicit Drug use: no Dentist: yes Ophthalmology: yes  Hypertension: Has been [id number] systolically while at work.  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  She notes she does get a little dehydrated taking the HCTZ though she does not drink much while at work.  Active Ambulatory Problems    Diagnosis Date Noted   Encounter for general adult medical examination with abnormal findings 07/04/2015   Depression 10/30/2015   Fatty liver 04/01/2016   Weight gain 07/13/2017   Abnormal CT scan, bladder 08/05/2017   Enlarged lymph node 08/05/2017   Liver lesion 01/11/2018   Cutaneous skin tags 01/11/2018   Vertigo 07/14/2018   Morbid obesity ([place]) 04/17/2020   PCOS (polycystic ovarian syndrome) 04/17/2020   Hypertension 04/17/2020   Irregular menses 04/17/2020   Resolved Ambulatory Problems    Diagnosis Date Noted   Acute bronchitis 05/23/2015   Accidental needlestick injury with exposure to body fluid 08/05/2017   Past Medical History:  Diagnosis Date   Chicken pox     Family History  Problem Relation Age of Onset   Hypertension Mother    Diabetes Father    Hyperlipidemia Father    Heart disease Father        Father became septic  and subsequently had a STEMI   Stroke Maternal Grandmother    Heart disease Maternal Grandmother    Cancer Maternal Grandfather    Ovarian cancer Paternal Grandmother    Heart disease Paternal Grandfather    Diabetes Paternal Grandfather    Drug abuse Brother    Ovarian cancer Paternal Aunt    Breast cancer Paternal Aunt    Breast cancer Paternal Aunt     Social History   Socioeconomic History   Marital status: Single    Spouse name: Not on file   Number of children: Not on file   Years of education: Not on file   Highest education level: Not on file  Occupational History   Not on file  Tobacco Use   Smoking status: Never   Smokeless tobacco: Never  Vaping Use   Vaping Use: Never used  Substance and Sexual Activity   Alcohol use: No    Alcohol/week: 0.0 standard drinks   Drug use: No   Sexual activity: Yes    Birth control/protection: None  Other Topics Concern   Not on file  Social History Narrative   Not on file   Social Determinants of Health   Financial Resource Strain: Not on file  Food Insecurity: Not on file  Transportation Needs: Not on file  Physical Activity: Not on file  Stress: Not on file  Social Connections: Not on file  Intimate Partner Violence: Not on file    ROS  General:  Negative for nexplained weight loss, fever Skin: Negative for new or changing mole, sore that won't heal HEENT: Negative for trouble hearing, trouble seeing, ringing in ears, mouth sores, hoarseness, change in voice, dysphagia. CV:  Negative for chest pain, dyspnea, edema, palpitations Resp: Negative for cough, dyspnea, hemoptysis GI: Negative for nausea, vomiting, diarrhea, constipation, abdominal pain, melena, hematochezia. GU: Negative for dysuria, incontinence, urinary hesitance, hematuria, vaginal or penile discharge, polyuria, sexual difficulty, lumps in testicle or breasts MSK: Negative for muscle cramps or aches, joint pain or swelling Neuro: Negative for  headaches, weakness, numbness, dizziness, passing out/fainting Psych: Negative for depression, anxiety, memory problems  Objective  Physical Exam Vitals:   05/27/21 1319  BP: 130/90  Pulse: 78  Temp: 98.6 F (37 C)  SpO2: 99%    BP Readings from Last 3 Encounters:  05/27/21 130/90  04/17/20 140/90  07/14/19 128/84   Wt Readings from Last 3 Encounters:  05/27/21 (!) 307 lb 9.6 oz (139.5 kg)  04/17/20 (!) 303 lb (137.4 kg)  07/14/19 296 lb (134.3 kg)    Physical Exam Constitutional:      General: She is not in acute distress.    Appearance: She is not diaphoretic.  HENT:     Head: Normocephalic and atraumatic.  Eyes:     Conjunctiva/sclera: Conjunctivae normal.     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     Heart sounds: Normal heart sounds.  Pulmonary:     Effort: Pulmonary effort is normal.     Breath sounds: Normal breath sounds.  Abdominal:     General: Bowel sounds are normal. There is no distension.     Palpations: Abdomen is soft.     Tenderness: There is no abdominal tenderness. There is no guarding or rebound.  Musculoskeletal:     Right lower leg: No edema.     Left lower leg: No edema.  Skin:    General: Skin is warm and dry.  Neurological:     Mental Status: She is alert.  Psychiatric:        Mood and Affect: Mood normal.     Assessment/Plan:   Problem List Items Addressed This Visit     Encounter for general adult medical examination with abnormal findings - Primary    Physical exam completed.  Encouraged healthy diet and exercise.  We discussed weight loss medications including injectable medications.  She is going to confirm her mother's history of thyroid cancer and what type of thyroid cancer she has.  The patient will call to schedule her mammogram.  Pelvic and breast exams were deferred to gynecology.   Vaccines are up-to-date.  Lab work as outlined.      Hypertension    Uncontrolled.  We will add amlodipine 5 mg once daily.  She will continue HCTZ 12.5 mg daily.  Lab work as outlined.      Relevant Medications   hydrochlorothiazide (HYDRODIURIL) 12.5 MG tablet   amLODipine (NORVASC) 5 MG tablet   Other Relevant Orders   Comp Met (CMET)   Lipid panel   Morbid obesity ([place])   Relevant Orders   HgB A1c   Other Visit Diagnoses     Screening for deficiency anemia       Relevant Orders   CBC   Thyroid disorder screen       Relevant Orders  TSH       Return in about 3 months (around 08/25/2021) for Hypertension.  This visit occurred during the SARS-CoV-2 public health emergency.  Safety protocols were in place, including screening questions prior to the visit, additional usage of staff PPE, and extensive cleaning of exam room while observing appropriate contact time as indicated for disinfecting solutions.    Tommi Rumps, MD [place]

## 2021-05-27 NOTE — Assessment & Plan Note (Signed)
Physical exam completed.  Encouraged healthy diet and exercise.  We discussed weight loss medications including injectable medications.  She is going to confirm her mother's history of thyroid cancer and what type of thyroid cancer she has.  The patient will call to schedule her mammogram.  Pelvic and breast exams were deferred to gynecology.  Vaccines are up-to-date.  Lab work as outlined.

## 2021-05-27 NOTE — Patient Instructions (Signed)
Nice to see you. Please check with your mom to see what kind of thyroid cancer she had. Please call [phone] to schedule your mammogram. We will add amlodipine to your hydrochlorothiazide for your blood pressure.  Please monitor your blood pressure at home.

## 2021-05-28 LAB — CBC
HCT: 38.6 % (ref 36.0–46.0)
Hemoglobin: 12.7 g/dL (ref 12.0–15.0)
MCHC: 32.9 g/dL (ref 30.0–36.0)
MCV: 82.1 fl (ref 78.0–100.0)
Platelets: 378 10*3/uL (ref 150.0–400.0)
RBC: 4.7 Mil/uL (ref 3.87–5.11)
RDW: 14.2 % (ref 11.5–15.5)
WBC: 8 10*3/uL (ref 4.0–10.5)

## 2021-05-28 LAB — HEMOGLOBIN A1C: Hgb A1c MFr Bld: 6.6 % — ABNORMAL HIGH (ref 4.6–6.5)

## 2021-05-28 LAB — COMPREHENSIVE METABOLIC PANEL
ALT: 26 U/L (ref 0–35)
AST: 24 U/L (ref 0–37)
Albumin: 4.2 g/dL (ref 3.5–5.2)
Alkaline Phosphatase: 60 U/L (ref 39–117)
BUN: 13 mg/dL (ref 6–23)
CO2: 28 mEq/L (ref 19–32)
Calcium: 9.6 mg/dL (ref 8.4–10.5)
Chloride: 102 mEq/L (ref 96–112)
Creatinine, Ser: 0.75 mg/dL (ref 0.40–1.20)
GFR: 98.02 mL/min (ref >60.00)
Glucose, Bld: 88 mg/dL (ref 70–99)
Potassium: 3.8 mEq/L (ref 3.5–5.1)
Sodium: 138 mEq/L (ref 135–145)
Total Bilirubin: 0.4 mg/dL (ref 0.2–1.2)
Total Protein: 6.7 g/dL (ref 6.0–8.3)

## 2021-05-28 LAB — LIPID PANEL
Cholesterol: 177 mg/dL (ref 0–200)
HDL: 47.7 mg/dL (ref >39.00)
LDL Cholesterol: 104 mg/dL — ABNORMAL HIGH (ref 0–99)
NonHDL: 129.73
Total CHOL/HDL Ratio: 4
Triglycerides: 128 mg/dL (ref 0.0–149.0)
VLDL: 25.6 mg/dL (ref 0.0–40.0)

## 2021-05-28 LAB — TSH: TSH: 2.44 u[IU]/mL (ref 0.35–5.50)

## 2021-06-03 ENCOUNTER — Other Ambulatory Visit: Payer: Self-pay

## 2021-06-06 ENCOUNTER — Other Ambulatory Visit: Payer: Self-pay

## 2021-06-06 ENCOUNTER — Other Ambulatory Visit: Payer: Self-pay | Admitting: Family Medicine

## 2021-06-06 DIAGNOSIS — R7303 Prediabetes: Secondary | ICD-10-CM

## 2021-06-06 MED ORDER — OZEMPIC (0.25 OR 0.5 MG/DOSE) 2 MG/1.5ML ~~LOC~~ SOPN
PEN_INJECTOR | SUBCUTANEOUS | 0 refills | Status: DC
  Filled 2021-06-06: qty 1.5, 28d supply, fill #0
  Filled 2021-06-26: qty 1.5, 30d supply, fill #1
  Filled 2021-08-14: qty 1.5, 30d supply, fill #2

## 2021-06-06 NOTE — Addendum Note (Signed)
Addended by: Charlyne Mom D on: 06/06/2021 09:55 AM   Modules accepted: Orders

## 2021-06-20 ENCOUNTER — Other Ambulatory Visit: Payer: Self-pay

## 2021-06-20 MED ORDER — CARESTART COVID-19 HOME TEST VI KIT
PACK | 0 refills | Status: DC
  Filled 2021-06-20: qty 2, 4d supply, fill #0

## 2021-06-26 ENCOUNTER — Other Ambulatory Visit: Payer: Self-pay

## 2021-06-26 ENCOUNTER — Other Ambulatory Visit: Payer: Self-pay | Admitting: Pharmacist

## 2021-06-26 MED ORDER — CARESTART COVID-19 HOME TEST VI KIT
PACK | 0 refills | Status: DC
  Filled 2021-06-26: qty 2, 4d supply, fill #0

## 2021-06-28 ENCOUNTER — Other Ambulatory Visit: Payer: Self-pay

## 2021-08-14 ENCOUNTER — Other Ambulatory Visit: Payer: Self-pay

## 2021-08-15 ENCOUNTER — Other Ambulatory Visit: Payer: Self-pay

## 2021-08-15 MED ORDER — CARESTART COVID-19 HOME TEST VI KIT
PACK | 0 refills | Status: AC
Start: 2021-08-15 — End: ?
  Filled 2021-08-15: qty 2, 4d supply, fill #0

## 2021-08-23 ENCOUNTER — Other Ambulatory Visit: Payer: Self-pay

## 2021-08-26 ENCOUNTER — Telehealth: Payer: Self-pay | Admitting: Family Medicine

## 2021-08-26 ENCOUNTER — Other Ambulatory Visit: Payer: Self-pay

## 2021-08-26 ENCOUNTER — Other Ambulatory Visit (INDEPENDENT_AMBULATORY_CARE_PROVIDER_SITE_OTHER): Payer: No Typology Code available for payment source

## 2021-08-26 DIAGNOSIS — Z131 Encounter for screening for diabetes mellitus: Secondary | ICD-10-CM | POA: Diagnosis not present

## 2021-08-26 LAB — HEMOGLOBIN A1C: Hgb A1c MFr Bld: 6 % (ref 4.6–6.5)

## 2021-08-26 NOTE — Telephone Encounter (Signed)
Patient dropped of a letter from her insurance about her Ozempic only being approved until 09/13/2021. She would like to continue on this medication and would like to see if Dr Caryl Bis can get it approved. Letter is up front in Dr Ellen Henri color folder. ?

## 2021-08-28 NOTE — Telephone Encounter (Signed)
I called the patient  and mailbox was full I wanted, to tell them from the provider. Please let the patient know that they will only approve this medication for a diagnosis of diabetes. She has prediabetes, though has not been diagnosed with diabetes at this time. We could see if they would approve it for that indication and we could provide her A1c history and see if they will approve it.  Bridget Hull,cma  ?

## 2021-08-28 NOTE — Telephone Encounter (Signed)
Please let the patient know that they will only approve this medication for a diagnosis of diabetes. She has prediabetes, though has not been diagnosed with diabetes at this time. We could see if they would approve it for that indication and we could provide her A1c history and see if they will approve it.  ?

## 2021-08-30 ENCOUNTER — Encounter: Payer: Self-pay | Admitting: Family Medicine

## 2021-09-02 ENCOUNTER — Other Ambulatory Visit: Payer: Self-pay

## 2021-09-02 ENCOUNTER — Other Ambulatory Visit: Payer: Self-pay | Admitting: Family Medicine

## 2021-09-02 ENCOUNTER — Encounter: Payer: Self-pay | Admitting: Family Medicine

## 2021-09-02 ENCOUNTER — Ambulatory Visit (INDEPENDENT_AMBULATORY_CARE_PROVIDER_SITE_OTHER): Payer: No Typology Code available for payment source | Admitting: Family Medicine

## 2021-09-02 DIAGNOSIS — I10 Essential (primary) hypertension: Secondary | ICD-10-CM | POA: Diagnosis not present

## 2021-09-02 DIAGNOSIS — R7303 Prediabetes: Secondary | ICD-10-CM | POA: Insufficient documentation

## 2021-09-02 MED ORDER — OZEMPIC (0.25 OR 0.5 MG/DOSE) 2 MG/1.5ML ~~LOC~~ SOPN
0.5000 mg | PEN_INJECTOR | SUBCUTANEOUS | 0 refills | Status: DC
  Filled 2021-09-02: qty 4.5, 84d supply, fill #0

## 2021-09-02 NOTE — Telephone Encounter (Signed)
I faxed a letter from the provider to [employer] for them to continue her medication coverage of Ozempic. Confirmation given.  Machaela Caterino,cma  ?

## 2021-09-02 NOTE — Assessment & Plan Note (Signed)
Continue healthy diet and exercise.  We will see if we can get Ozempic approved for a formulary exception. ?

## 2021-09-02 NOTE — Patient Instructions (Signed)
Nice to see you. ?We will send another letter to your insurance to see if we can get a formulary exception for the [place].  If they will not approve this we will see if they will approve [facility] for weight loss. ?

## 2021-09-02 NOTE — Assessment & Plan Note (Signed)
Well-controlled.  She will continue amlodipine 5 mg once daily and HCTZ 12.5 mg daily. ?

## 2021-09-02 NOTE — Progress Notes (Signed)
?Tommi Rumps, MD ?Phone: [phone] ? ?Bridget Hull is a 41 y.o. female who presents today for f/u. ? ?HYPERTENSION ?Disease Monitoring ?Home BP Monitoring 110s-130s/70s-80s Chest pain- no    Dyspnea- no ?Medications ?Compliance-  taking amlodipine, HCTZ.  Edema- no ?BMET ?   ?Component Value Date/Time  ? NA 138 05/27/2021 1348  ? K 3.8 05/27/2021 1348  ? CL 102 05/27/2021 1348  ? CO2 28 05/27/2021 1348  ? GLUCOSE 88 05/27/2021 1348  ? BUN 13 05/27/2021 1348  ? CREATININE 0.75 05/27/2021 1348  ? CALCIUM 9.6 05/27/2021 1348  ? ?Prediabetes/obesity: The patient has been on Ozempic for about 3 months.  She notes this has been beneficial.  She not been eating as many sweets.  This is helped her cut down her simple carbohydrates.  She has been swimming for exercise.  She had no nausea or abdominal pain.  She notes she felt incredibly weak while taking metformin previously.  She notes her insurance will not cover the Ozempic moving forward unless there is a formulary exception. ? ?Social History  ? ?Tobacco Use  ?Smoking Status Never  ?Smokeless Tobacco Never  ? ? ?Current Outpatient Medications on File Prior to Visit  ?Medication Sig Dispense Refill  ? amLODipine (NORVASC) 5 MG tablet Take 1 tablet (5 mg total) by mouth daily. 90 tablet 3  ? COVID-19 At Home Antigen Test [facility] COVID-19 HOME TEST) KIT Use as directed 2 kit 0  ? doxylamine, Sleep, (UNISOM) 25 MG tablet Take 25 mg by mouth at bedtime as needed.    ? hydrochlorothiazide (HYDRODIURIL) 12.5 MG tablet Take 1 tablet (12.5 mg total) by mouth daily. 90 tablet 3  ? Semaglutide,0.25 or 0.5MG/DOS, (OZEMPIC, 0.25 OR 0.5 MG/DOSE,) 2 MG/1.5ML SOPN Inject 0.25 mg into the skin once a week for 28 days, THEN 0.5 mg once a week. 4.5 mL 0  ? UNABLE TO FIND Med Name:Alteril    ? ?No current facility-administered medications on file prior to visit.  ? ? ? ?ROS see history of present illness ? ?Objective ? ?Physical Exam ?Vitals:  ? 08/30/21 1644  ?BP: 120/80   ?Pulse: 96  ?Temp: 98.7 ?F (37.1 ?C)  ?SpO2: 97%  ? ? ?BP Readings from Last 3 Encounters:  ?08/30/21 120/80  ?05/27/21 130/90  ?04/17/20 140/90  ? ?Wt Readings from Last 3 Encounters:  ?08/30/21 296 lb 12.8 oz (134.6 kg)  ?05/27/21 (!) 307 lb 9.6 oz (139.5 kg)  ?04/17/20 (!) 303 lb (137.4 kg)  ? ? ?Physical Exam ?Constitutional:   ?   General: She is not in acute distress. ?   Appearance: She is not diaphoretic.  ?Cardiovascular:  ?   Rate and Rhythm: Normal rate and regular rhythm.  ?   Heart sounds: Normal heart sounds.  ?Pulmonary:  ?   Effort: Pulmonary effort is normal.  ?   Breath sounds: Normal breath sounds.  ?Skin: ?   General: Skin is warm and dry.  ?Neurological:  ?   Mental Status: She is alert.  ? ? ? ?Assessment/Plan: Please see individual problem list. ? ?Problem List Items Addressed This Visit   ? ? Hypertension  ?  Well-controlled.  She will continue amlodipine 5 mg once daily and HCTZ 12.5 mg daily. ?  ?  ? Morbid obesity ([place])  ?  Continue healthy diet and exercise.  We will see if we can get Ozempic approved for a formulary exception. ?  ?  ? Prediabetes  ?  The patient has a  diagnosis of prediabetes.  She had a single A1c of 6.6 though she subsequently started on Ozempic and her A1c came down to 6.0.  She has benefited from the [place] with weight loss and reduction in her A1c.  We will see if we can get the Ozempic approved with a formulary exception.  She has not tolerated metformin in the past. ?  ?  ? ? ? ?Return in about 3 months (around 12/03/2021) for Weight/prediabetes. ? ?This visit occurred during the SARS-CoV-2 public health emergency.  Safety protocols were in place, including screening questions prior to the visit, additional usage of staff PPE, and extensive cleaning of exam room while observing appropriate contact time as indicated for disinfecting solutions.  ? ? ?Tommi Rumps, MD ?[place] ? ?

## 2021-09-02 NOTE — Assessment & Plan Note (Signed)
The patient has a diagnosis of prediabetes.  She had a single A1c of 6.6 though she subsequently started on Ozempic and her A1c came down to 6.0.  She has benefited from the Ozempic with weight loss and reduction in her A1c.  We will see if we can get the Ozempic approved with a formulary exception.  She has not tolerated metformin in the past. ?

## 2021-09-09 ENCOUNTER — Other Ambulatory Visit: Payer: Self-pay

## 2021-09-12 ENCOUNTER — Other Ambulatory Visit: Payer: Self-pay

## 2021-09-23 ENCOUNTER — Telehealth: Payer: Self-pay | Admitting: Family Medicine

## 2021-09-23 ENCOUNTER — Other Ambulatory Visit: Payer: Self-pay

## 2021-09-23 DIAGNOSIS — I10 Essential (primary) hypertension: Secondary | ICD-10-CM

## 2021-09-23 MED ORDER — HYDROCHLOROTHIAZIDE 25 MG PO TABS
12.5000 mg | ORAL_TABLET | Freq: Every day | ORAL | 3 refills | Status: DC
  Filled 2021-09-23: qty 30, 60d supply, fill #0
  Filled 2021-11-15: qty 30, 60d supply, fill #1

## 2021-09-23 NOTE — Addendum Note (Signed)
Addended by: Birdie Sons Kaicee Scarpino G on: 09/23/2021 12:46 PM ? ? Modules accepted: Orders ? ?

## 2021-09-23 NOTE — Telephone Encounter (Signed)
Patient's dog got into her hydrochlorothiazide (HYDRODIURIL) 12.5 MG tablet. Patient's pharmacy stated if she got 60 day supple and the 25mg  and cut in half it would be cheaper for her. ?

## 2021-09-23 NOTE — Telephone Encounter (Signed)
New prescription sent to pharmacy 

## 2021-09-25 NOTE — Telephone Encounter (Signed)
Pt called in stating that [employer] is requesting to speak with the provider about medication (Semaglutide,0.25 or 0.5MG /DOS, (OZEMPIC, 0.25 OR 0.5 MG/DOSE,) 2 MG/1.5ML SOPN)... Pt stated that the only way the will exempt it is by speaking with provider... Pt stated [employer] number is [phone]...  ?

## 2021-09-25 NOTE — Telephone Encounter (Signed)
Please reach out to them tomorrow to see exactly what they need.  Thanks. ?

## 2021-09-30 NOTE — Telephone Encounter (Signed)
Noted  

## 2021-10-01 ENCOUNTER — Telehealth: Payer: Self-pay

## 2021-10-01 NOTE — Telephone Encounter (Signed)
I called and spoke with the patient and informed her that the PA for the Ozempic was denied and the provider stated that he would check her a1c and see if it gets back in the diabetes rage and then she could get approved and she understood.  Bridget Hull,cma  ?

## 2021-10-01 NOTE — Telephone Encounter (Signed)
Noted. Please let the patient know that the insurance denied her ozempic. We can always check her A1c after she comes off of it to see if she ends up in the diabetic range again. She would then meet criteria for diabetes and then we could potentially get it approved given that she has been on metformin previously and did not tolerate it.  ?

## 2021-10-01 NOTE — Telephone Encounter (Signed)
I did a PA on the Ozempic and it was denied for the following:  patient does not have type 2 diabetes, and she has had a trial of metformin, a sulfonylurea, pioglitazone, or any combination product containing any of these.  The letter was placed in the lab basket for your review.  Bridget Hull,cma  ?

## 2021-11-15 ENCOUNTER — Other Ambulatory Visit: Payer: Self-pay

## 2021-11-26 ENCOUNTER — Other Ambulatory Visit: Payer: Self-pay

## 2021-12-04 ENCOUNTER — Other Ambulatory Visit: Payer: Self-pay

## 2021-12-04 ENCOUNTER — Encounter: Payer: Self-pay | Admitting: Family Medicine

## 2021-12-04 ENCOUNTER — Ambulatory Visit (INDEPENDENT_AMBULATORY_CARE_PROVIDER_SITE_OTHER): Payer: No Typology Code available for payment source | Admitting: Family Medicine

## 2021-12-04 VITALS — BP 120/80 | HR 84 | Temp 99.4°F | Ht 69.0 in | Wt 303.2 lb

## 2021-12-04 DIAGNOSIS — E282 Polycystic ovarian syndrome: Secondary | ICD-10-CM

## 2021-12-04 DIAGNOSIS — R7303 Prediabetes: Secondary | ICD-10-CM | POA: Diagnosis not present

## 2021-12-04 DIAGNOSIS — I10 Essential (primary) hypertension: Secondary | ICD-10-CM | POA: Diagnosis not present

## 2021-12-04 LAB — POCT GLYCOSYLATED HEMOGLOBIN (HGB A1C): Hemoglobin A1C: 6 % — AB (ref 4.0–5.6)

## 2021-12-04 LAB — GLUCOSE, POCT (MANUAL RESULT ENTRY): POC Glucose: 171 mg/dl — AB (ref 70–99)

## 2021-12-04 MED ORDER — SEMAGLUTIDE-WEIGHT MANAGEMENT 0.25 MG/0.5ML ~~LOC~~ SOAJ
0.2500 mg | SUBCUTANEOUS | 0 refills | Status: AC
Start: 2021-12-04 — End: 2022-01-01
  Filled 2021-12-04 – 2021-12-18 (×4): qty 2, 28d supply, fill #0

## 2021-12-04 MED ORDER — SEMAGLUTIDE-WEIGHT MANAGEMENT 2.4 MG/0.75ML ~~LOC~~ SOAJ
2.4000 mg | SUBCUTANEOUS | 0 refills | Status: DC
  Filled 2021-12-04 – 2022-04-03 (×2): qty 3, 28d supply, fill #0

## 2021-12-04 MED ORDER — SEMAGLUTIDE-WEIGHT MANAGEMENT 0.5 MG/0.5ML ~~LOC~~ SOAJ
0.5000 mg | SUBCUTANEOUS | 0 refills | Status: AC
  Filled 2021-12-04: qty 2, 28d supply, fill #0

## 2021-12-04 MED ORDER — SEMAGLUTIDE-WEIGHT MANAGEMENT 1 MG/0.5ML ~~LOC~~ SOAJ
1.0000 mg | SUBCUTANEOUS | 0 refills | Status: AC
  Filled 2021-12-04 – 2022-01-23 (×2): qty 2, 28d supply, fill #0

## 2021-12-04 MED ORDER — SEMAGLUTIDE-WEIGHT MANAGEMENT 1.7 MG/0.75ML ~~LOC~~ SOAJ
1.7000 mg | SUBCUTANEOUS | 0 refills | Status: AC
  Filled 2021-12-04 – 2022-03-11 (×2): qty 3, 28d supply, fill #0

## 2021-12-04 MED ORDER — SPIRONOLACTONE 25 MG PO TABS
25.0000 mg | ORAL_TABLET | Freq: Every day | ORAL | 3 refills | Status: DC
  Filled 2021-12-04: qty 90, 90d supply, fill #0

## 2021-12-04 NOTE — Assessment & Plan Note (Signed)
History of this.  This may be contributing to her weight issues.  She has had some facial hair growth and thus we will start on spironolactone to see if that will provide any antiandrogen effect for her.  Discussed the risk if she were to become pregnant.  She reassures me that her partner has had a vasectomy and that there is no risk of her getting pregnant.  Advised if she were to become pregnant she would need to discontinue this medication immediately and let us know.

## 2021-12-04 NOTE — Assessment & Plan Note (Addendum)
Adequate control.  We will switch her HCTZ over to spironolactone 25 mg daily to see if that provides any benefit for androgen excess related to PCOS.  She will continue amlodipine 5 mg daily.  She will return in 2 weeks for labs.  She will let us know if her blood pressure trends up with this change.  Patient reports her partner has had a vasectomy.  She notes there is no risk of her getting pregnant given that her only partner has had a vasectomy.

## 2021-12-04 NOTE — Assessment & Plan Note (Addendum)
Encouraged continued diet and exercise.  We will see if we can get [facility] approved for the patient.  She was previously on Ozempic with good benefit.

## 2021-12-04 NOTE — Assessment & Plan Note (Signed)
A1c is stable at 6.0.  She will continue diet and exercise.

## 2021-12-04 NOTE — Patient Instructions (Signed)
Nice to see you. We will see if we can get [facility] approved. We are switching you over to spironolactone.  Please monitor your blood pressure.  If you ever become pregnant you need to discontinue this right away and let us know. Please continue to work on diet and exercise.

## 2021-12-04 NOTE — Progress Notes (Signed)
Tommi Rumps, MD Phone: [phone]  Bridget Hull is a 41 y.o. female who presents today for f/u.  HYPERTENSION Disease Monitoring Home BP Monitoring [id number] systolic Chest pain- no    Dyspnea- no Medications Compliance-  taking amlodipine, HCTZ.  Edema- occasional BMET    Component Value Date/Time   NA 138 05/27/2021 1348   K 3.8 05/27/2021 1348   CL 102 05/27/2021 1348   CO2 28 05/27/2021 1348   GLUCOSE 88 05/27/2021 1348   BUN 13 05/27/2021 1348   CREATININE 0.75 05/27/2021 1348   CALCIUM 9.6 05/27/2021 1348   Obesity/prediabetes: Patient continues to exercise 5 times a week.  She has a generally healthy diet.  She was previously on [employer] stopped covering this.  Patient does have a history of headaches but those orders in PCOS.  She did lose weight when she was on Ozempic previously.  PCOS: Patient notes having some facial hair issues more recently.  Social History   Tobacco Use  Smoking Status Never  Smokeless Tobacco Never    Current Outpatient Medications on File Prior to Visit  Medication Sig Dispense Refill   amLODipine (NORVASC) 5 MG tablet Take 1 tablet (5 mg total) by mouth daily. 90 tablet 3   COVID-19 At Home Antigen Test ([facility] COVID-19 HOME TEST) KIT Use as directed 2 kit 0   doxylamine, Sleep, (UNISOM) 25 MG tablet Take 25 mg by mouth at bedtime as needed.     UNABLE TO FIND Med Name:Alteril     No current facility-administered medications on file prior to visit.     ROS see history of present illness  Objective  Physical Exam Vitals:   12/04/21 1000  BP: 120/80  Pulse: 84  Temp: 99.4 F (37.4 C)  SpO2: 98%    BP Readings from Last 3 Encounters:  12/04/21 120/80  08/30/21 120/80  05/27/21 130/90   Wt Readings from Last 3 Encounters:  12/04/21 (!) 303 lb 3.2 oz (137.5 kg)  08/30/21 296 lb 12.8 oz (134.6 kg)  05/27/21 (!) 307 lb 9.6 oz (139.5 kg)    Physical Exam Constitutional:      General: She is not in  acute distress.    Appearance: She is not diaphoretic.  Cardiovascular:     Rate and Rhythm: Normal rate and regular rhythm.     Heart sounds: Normal heart sounds.  Pulmonary:     Effort: Pulmonary effort is normal.     Breath sounds: Normal breath sounds.  Skin:    General: Skin is warm and dry.  Neurological:     Mental Status: She is alert.      Assessment/Plan: Please see individual problem list.  Problem List Items Addressed This Visit     Hypertension - Primary (Chronic)    Adequate control.  We will switch her HCTZ over to spironolactone 25 mg daily to see if that provides any benefit for androgen excess related to PCOS.  She will continue amlodipine 5 mg daily.  She will return in 2 weeks for labs.  She will let us know if her blood pressure trends up with this change.  Patient reports her partner has had a vasectomy.  She notes there is no risk of her getting pregnant given that her only partner has had a vasectomy.      Relevant Medications   spironolactone (ALDACTONE) 25 MG tablet   Other Relevant Orders   Basic Metabolic Panel (BMET)   Morbid obesity ([place]) (Chronic)    Encouraged  continued diet and exercise.  We will see if we can get [facility] approved for the patient.  She was previously on Ozempic with good benefit.      Relevant Medications   Semaglutide-Weight Management 0.25 MG/0.5ML SOAJ   Semaglutide-Weight Management 0.5 MG/0.5ML SOAJ (Start on 01/02/2022)   Semaglutide-Weight Management 1 MG/0.5ML SOAJ (Start on 01/31/2022)   Semaglutide-Weight Management 1.7 MG/0.75ML SOAJ (Start on 03/01/2022)   Semaglutide-Weight Management 2.4 MG/0.75ML SOAJ (Start on 03/30/2022)   PCOS (polycystic ovarian syndrome) (Chronic)    History of this.  This may be contributing to her weight issues.  She has had some facial hair growth and thus we will start on spironolactone to see if that will provide any antiandrogen effect for her.  Discussed the risk if she were to become  pregnant.  She reassures me that her partner has had a vasectomy and that there is no risk of her getting pregnant.  Advised if she were to become pregnant she would need to discontinue this medication immediately and let us know.      Prediabetes (Chronic)    A1c is stable at 6.0.  She will continue diet and exercise.      Relevant Orders   POCT HgB A1C (Completed)   POCT Glucose (CBG) (Completed)   Return in about 3 months (around 03/06/2022) for Weight follow-up, 2 weeks from now for labs.   Tommi Rumps, MD [place]

## 2021-12-09 ENCOUNTER — Other Ambulatory Visit: Payer: Self-pay

## 2021-12-18 ENCOUNTER — Telehealth: Payer: Self-pay

## 2021-12-18 ENCOUNTER — Other Ambulatory Visit: Payer: Self-pay

## 2021-12-18 NOTE — Telephone Encounter (Signed)
Received a fax that the [facility] was approved from 12/12/2021-07/03/2022.  Pharmacy was notified.  Kirra Verga,cma

## 2021-12-19 ENCOUNTER — Other Ambulatory Visit: Payer: No Typology Code available for payment source

## 2022-01-02 ENCOUNTER — Telehealth: Payer: Self-pay | Admitting: Family Medicine

## 2022-01-02 NOTE — Telephone Encounter (Signed)
Spoke to patient about her BP and she stated that she takes her bp medication at night and checks her bp in the mornings. Patient stated that the last couple of readings have been in the 140's Systolic and the 29'W Diastolic? She has not increased it as of yet but we did get her an appointment  to come into office on 01/06/22.

## 2022-01-02 NOTE — Telephone Encounter (Signed)
Patient called stating that her BP is rising as dicussed and wants to know if she could double up BP medicine.

## 2022-01-02 NOTE — Telephone Encounter (Signed)
Ok norvasc 5 mg qd and spironoactone 50 mg daily

## 2022-01-02 NOTE — Telephone Encounter (Signed)
What is her BP did she take norvasc 5 mg today? Check BP 2 hours after meds what time did she take meds if so?  The max dose is 10 mg for goal BP <130/<80 Sch f/u with PCP if this is concern  Reduced salt

## 2022-01-02 NOTE — Telephone Encounter (Addendum)
Spoke to patient and informed her to take Norvasc 5 mg 2x per day  and log her bp and bring the log with her to her  f/up on 01/06/22. Patient stated that she wanted to increase the Spironactone instead of the Norvasc??

## 2022-01-02 NOTE — Telephone Encounter (Signed)
Called patient back but vm has not been set up so therefore I could not leave a VM

## 2022-01-02 NOTE — Telephone Encounter (Signed)
She can take 5 mg 2x  per day until f/u  Log BP and bring BP log with her to clinic

## 2022-01-06 ENCOUNTER — Other Ambulatory Visit: Payer: Self-pay

## 2022-01-06 ENCOUNTER — Ambulatory Visit (INDEPENDENT_AMBULATORY_CARE_PROVIDER_SITE_OTHER): Payer: No Typology Code available for payment source | Admitting: Family Medicine

## 2022-01-06 ENCOUNTER — Encounter: Payer: Self-pay | Admitting: Family Medicine

## 2022-01-06 VITALS — BP 136/78 | HR 90 | Temp 98.1°F | Ht 69.0 in | Wt 295.8 lb

## 2022-01-06 DIAGNOSIS — I10 Essential (primary) hypertension: Secondary | ICD-10-CM

## 2022-01-06 LAB — BASIC METABOLIC PANEL
BUN: 16 mg/dL (ref 6–23)
CO2: 27 mEq/L (ref 19–32)
Calcium: 9.2 mg/dL (ref 8.4–10.5)
Chloride: 104 mEq/L (ref 96–112)
Creatinine, Ser: 0.86 mg/dL (ref 0.40–1.20)
GFR: 82.82 mL/min (ref >60.00)
Glucose, Bld: 88 mg/dL (ref 70–99)
Potassium: 4 mEq/L (ref 3.5–5.1)
Sodium: 138 mEq/L (ref 135–145)

## 2022-01-06 MED ORDER — SPIRONOLACTONE 50 MG PO TABS
50.0000 mg | ORAL_TABLET | Freq: Every day | ORAL | 1 refills | Status: DC
  Filled 2022-01-06 – 2022-01-23 (×2): qty 90, 90d supply, fill #0
  Filled 2022-04-29: qty 90, 90d supply, fill #1

## 2022-01-06 NOTE — Progress Notes (Signed)
Tommi Rumps, MD Phone: [phone]  Bridget Hull is a 41 y.o. female who presents today for follow-up.  HYPERTENSION Disease Monitoring Home BP Monitoring 140s/90s Chest pain- no    Dyspnea- no Medications Compliance-  taking spironolactone 25 mg daily, amlodipine 5 mg daily.  Edema-some with it being summer and her being on her feet all the time with her job.  Notes there is some residual swelling after they are propped up while sleeping. There were some messages in the chart where the patient was advised to increase the amlodipine to 10 mg daily though she did not want to do this given the swelling.  She is more interested in increasing the spironolactone to 50 mg daily and seeing how she does on that. BMET    Component Value Date/Time   NA 138 05/27/2021 1348   K 3.8 05/27/2021 1348   CL 102 05/27/2021 1348   CO2 28 05/27/2021 1348   GLUCOSE 88 05/27/2021 1348   BUN 13 05/27/2021 1348   CREATININE 0.75 05/27/2021 1348   CALCIUM 9.6 05/27/2021 1348   Obesity: Patient notes that she has started the [facility].  She has been on the 0.25 mg dose.  She is had no nausea or abdominal pain.  Her weight has trended down.  Social History   Tobacco Use  Smoking Status Never  Smokeless Tobacco Never    Current Outpatient Medications on File Prior to Visit  Medication Sig Dispense Refill   amLODipine (NORVASC) 5 MG tablet Take 1 tablet (5 mg total) by mouth daily. 90 tablet 3   COVID-19 At Home Antigen Test (CARESTART COVID-19 HOME TEST) KIT Use as directed 2 kit 0   doxylamine, Sleep, (UNISOM) 25 MG tablet Take 25 mg by mouth at bedtime as needed.     Semaglutide-Weight Management 0.25 MG/0.5ML SOAJ Inject 0.25 mg into the skin once a week for 28 days. 2 mL 0   Semaglutide-Weight Management 0.5 MG/0.5ML SOAJ Inject 0.5 mg into the skin once a week for 28 days. 2 mL 0   [START ON 01/31/2022] Semaglutide-Weight Management 1 MG/0.5ML SOAJ Inject 1 mg into the skin once a week for 28  days. 2 mL 0   [START ON 03/01/2022] Semaglutide-Weight Management 1.7 MG/0.75ML SOAJ Inject 1.7 mg into the skin once a week for 28 days. 3 mL 0   [START ON 03/30/2022] Semaglutide-Weight Management 2.4 MG/0.75ML SOAJ Inject 2.4 mg into the skin once a week for 28 days. 3 mL 0   UNABLE TO FIND Med Name:Alteril     No current facility-administered medications on file prior to visit.     ROS see history of present illness  Objective  Physical Exam Vitals:   01/06/22 1041  BP: 136/78  Pulse: 90  Temp: 98.1 F (36.7 C)  SpO2: 98%    BP Readings from Last 3 Encounters:  01/06/22 136/78  12/04/21 120/80  08/30/21 120/80   Wt Readings from Last 3 Encounters:  01/06/22 295 lb 12.8 oz (134.2 kg)  12/04/21 (!) 303 lb 3.2 oz (137.5 kg)  08/30/21 296 lb 12.8 oz (134.6 kg)    Physical Exam Constitutional:      General: She is not in acute distress.    Appearance: She is not diaphoretic.  Cardiovascular:     Rate and Rhythm: Normal rate and regular rhythm.     Heart sounds: Normal heart sounds.  Pulmonary:     Effort: Pulmonary effort is normal.     Breath sounds: Normal breath  sounds.  Musculoskeletal:     Comments: Trace pitting edema to the midshin  Skin:    General: Skin is warm and dry.  Neurological:     Mental Status: She is alert.      Assessment/Plan: Please see individual problem list.  Problem List Items Addressed This Visit     Hypertension - Primary (Chronic)    Above goal.  Given her mild swelling I do agree that maintaining her amlodipine dose of 5 mg daily would be prudent.  We will increase her spironolactone to 50 mg daily.  We will check lab work today and then in 10 days.  She will follow-up with me in 4 to 6 weeks.  She will let me know if her blood pressure does not trend down.      Relevant Medications   spironolactone (ALDACTONE) 50 MG tablet   Other Relevant Orders   Basic Metabolic Panel (BMET)   Morbid obesity ([place]) (Chronic)    I  encouraged her to continue titrating up on the [facility].  She will monitor for any side effects.       Return in about 10 days (around 01/16/2022) for (BMP), follow-up PCP as scheduled.   Tommi Rumps, MD [place]

## 2022-01-06 NOTE — Telephone Encounter (Addendum)
Saw patient in office today and informed her that she could in fact increase the [facility]

## 2022-01-06 NOTE — Patient Instructions (Signed)
Nice to see you. We will check labs today and in 10 days. Please increase the spironolactone to 50 mg once daily.  A new prescription was sent to your pharmacy for 50 mg tablets.

## 2022-01-06 NOTE — Assessment & Plan Note (Signed)
Above goal.  Given her mild swelling I do agree that maintaining her amlodipine dose of 5 mg daily would be prudent.  We will increase her spironolactone to 50 mg daily.  We will check lab work today and then in 10 days.  She will follow-up with me in 4 to 6 weeks.  She will let me know if her blood pressure does not trend down.

## 2022-01-06 NOTE — Assessment & Plan Note (Signed)
I encouraged her to continue titrating up on the [facility].  She will monitor for any side effects.

## 2022-01-07 ENCOUNTER — Other Ambulatory Visit: Payer: Self-pay

## 2022-01-16 ENCOUNTER — Other Ambulatory Visit: Payer: No Typology Code available for payment source

## 2022-01-20 ENCOUNTER — Other Ambulatory Visit: Payer: No Typology Code available for payment source

## 2022-01-21 ENCOUNTER — Other Ambulatory Visit: Payer: Self-pay

## 2022-01-23 ENCOUNTER — Other Ambulatory Visit: Payer: Self-pay

## 2022-03-11 ENCOUNTER — Other Ambulatory Visit: Payer: Self-pay

## 2022-03-12 ENCOUNTER — Ambulatory Visit: Payer: No Typology Code available for payment source | Admitting: Family Medicine

## 2022-03-13 ENCOUNTER — Other Ambulatory Visit: Payer: Self-pay

## 2022-04-03 ENCOUNTER — Other Ambulatory Visit: Payer: Self-pay

## 2022-04-28 ENCOUNTER — Encounter: Payer: Self-pay | Admitting: Family Medicine

## 2022-04-28 ENCOUNTER — Ambulatory Visit (INDEPENDENT_AMBULATORY_CARE_PROVIDER_SITE_OTHER): Payer: No Typology Code available for payment source | Admitting: Family Medicine

## 2022-04-28 ENCOUNTER — Other Ambulatory Visit: Payer: Self-pay

## 2022-04-28 VITALS — BP 118/66 | HR 88 | Temp 98.9°F | Ht 69.0 in | Wt 282.6 lb

## 2022-04-28 DIAGNOSIS — R079 Chest pain, unspecified: Secondary | ICD-10-CM | POA: Insufficient documentation

## 2022-04-28 DIAGNOSIS — I10 Essential (primary) hypertension: Secondary | ICD-10-CM | POA: Diagnosis not present

## 2022-04-28 DIAGNOSIS — R072 Precordial pain: Secondary | ICD-10-CM | POA: Diagnosis not present

## 2022-04-28 DIAGNOSIS — R7303 Prediabetes: Secondary | ICD-10-CM

## 2022-04-28 LAB — BASIC METABOLIC PANEL
BUN: 15 mg/dL (ref 6–23)
CO2: 26 mEq/L (ref 19–32)
Calcium: 9.8 mg/dL (ref 8.4–10.5)
Chloride: 104 mEq/L (ref 96–112)
Creatinine, Ser: 0.99 mg/dL (ref 0.40–1.20)
GFR: 69.79 mL/min (ref >60.00)
Glucose, Bld: 84 mg/dL (ref 70–99)
Potassium: 4.4 mEq/L (ref 3.5–5.1)
Sodium: 136 mEq/L (ref 135–145)

## 2022-04-28 LAB — HEMOGLOBIN A1C: Hgb A1c MFr Bld: 5.7 % (ref 4.6–6.5)

## 2022-04-28 MED ORDER — SEMAGLUTIDE-WEIGHT MANAGEMENT 2.4 MG/0.75ML ~~LOC~~ SOAJ: 2.4000 mg | SUBCUTANEOUS | 1 refills | Status: DC

## 2022-04-28 MED ORDER — SEMAGLUTIDE-WEIGHT MANAGEMENT 2.4 MG/0.75ML ~~LOC~~ SOAJ
2.4000 mg | SUBCUTANEOUS | 1 refills | Status: DC
  Filled 2022-04-28: qty 3, 28d supply, fill #0
  Filled 2022-04-29: qty 9, 84d supply, fill #0
  Filled 2022-07-02: qty 9, 84d supply, fill #1

## 2022-04-28 NOTE — Progress Notes (Signed)
Tommi Rumps, MD Phone: [phone]  Bridget Hull is a 41 y.o. female who presents today for f/u.  HYPERTENSION Disease Monitoring Home BP Monitoring not checking at home, though occasionally checks at work and is 491P systolic, patient works in the ED  Chest pain- see below    Dyspnea- see below Medications Compliance-  taking amlodipine, spironolactone.  Edema- stable BMET    Component Value Date/Time   NA 138 01/06/2022 1105   K 4.0 01/06/2022 1105   CL 104 01/06/2022 1105   CO2 27 01/06/2022 1105   GLUCOSE 88 01/06/2022 1105   BUN 16 01/06/2022 1105   CREATININE 0.86 01/06/2022 1105   CALCIUM 9.2 01/06/2022 1105   Chest pain: Patient reports an episode of chest discomfort while she was at work.  It was a particularly stressful event where patient was bleeding to death.  Her heart rate got in the 160-170 range.  She had some shortness of breath with this.  The chest pain was described as a heaviness.  She was not evaluated for this though had an EKG done by one of the techs in the ED and she notes her EKG was fine.  She is very emotionally involved with the patient.  She does exercise on a daily basis and gets her heart rate into the 160-170 range with no chest discomfort or significant shortness of breath. She does report her father had an MI at age 67.   Obesity: weight has trended down with wegovy. She notes no side effects. She is swimming most days. Eating smaller volumes.   Social History   Tobacco Use  Smoking Status Never  Smokeless Tobacco Never    Current Outpatient Medications on File Prior to Visit  Medication Sig Dispense Refill   amLODipine (NORVASC) 5 MG tablet Take 1 tablet (5 mg total) by mouth daily. 90 tablet 3   COVID-19 At Home Antigen Test (CARESTART COVID-19 HOME TEST) KIT Use as directed 2 kit 0   doxylamine, Sleep, (UNISOM) 25 MG tablet Take 25 mg by mouth at bedtime as needed.     spironolactone (ALDACTONE) 50 MG tablet Take 1 tablet (50 mg  total) by mouth daily. 90 tablet 1   UNABLE TO FIND Med Name:Alteril     No current facility-administered medications on file prior to visit.     ROS see history of present illness  Objective  Physical Exam Vitals:   04/28/22 0922  BP: 118/66  Pulse: 88  Temp: 98.9 F (37.2 C)  SpO2: 98%    BP Readings from Last 3 Encounters:  04/28/22 118/66  01/06/22 136/78  12/04/21 120/80   Wt Readings from Last 3 Encounters:  04/28/22 282 lb 9.6 oz (128.2 kg)  01/06/22 295 lb 12.8 oz (134.2 kg)  12/04/21 (!) 303 lb 3.2 oz (137.5 kg)    Physical Exam Constitutional:      General: She is not in acute distress.    Appearance: She is not diaphoretic.  Cardiovascular:     Rate and Rhythm: Normal rate and regular rhythm.     Heart sounds: Normal heart sounds.  Pulmonary:     Effort: Pulmonary effort is normal.     Breath sounds: Normal breath sounds.  Skin:    General: Skin is warm and dry.  Neurological:     Mental Status: She is alert.      Assessment/Plan: Please see individual problem list.  Problem List Items Addressed This Visit     Hypertension - Primary (Chronic)  Adequately controlled today. She will continue amlodipine 5 mg daily and spironolactone 50 mg daily. Labs today.       Relevant Orders   Basic Metabolic Panel (BMET)   Morbid obesity ([facility]) (Chronic)    Continue diet and exercise. She can continue wegovy 2.4 mg weekly.       Relevant Medications   Semaglutide-Weight Management 2.4 MG/0.75ML SOAJ   Prediabetes (Chronic)   Relevant Orders   HgB A1c   Chest pain    Likely related to stress from the event she was dealing with at work. She is very active with no similar symptoms during exercise. She will monitor for any recurrence. I did discuss having her see cardiology at some point given her family history though noted that could likely wait until she has established with a new provider in the [facility] system.        No follow-up scheduled as the  patient will likely transfer care to [facility] as she is starting a new job there soon.   Tommi Rumps, MD [place]

## 2022-04-28 NOTE — Assessment & Plan Note (Signed)
Likely related to stress from the event she was dealing with at work. She is very active with no similar symptoms during exercise. She will monitor for any recurrence. I did discuss having her see cardiology at some point given her family history though noted that could likely wait until she has established with a new provider in the [facility] system.

## 2022-04-28 NOTE — Assessment & Plan Note (Signed)
Continue diet and exercise. She can continue wegovy 2.4 mg weekly.

## 2022-04-28 NOTE — Assessment & Plan Note (Signed)
Adequately controlled today. She will continue amlodipine 5 mg daily and spironolactone 50 mg daily. Labs today.

## 2022-04-29 ENCOUNTER — Other Ambulatory Visit: Payer: Self-pay

## 2022-05-01 ENCOUNTER — Other Ambulatory Visit: Payer: Self-pay

## 2022-05-20 ENCOUNTER — Other Ambulatory Visit: Payer: Self-pay | Admitting: Family Medicine

## 2022-05-20 ENCOUNTER — Other Ambulatory Visit: Payer: Self-pay

## 2022-05-20 DIAGNOSIS — I10 Essential (primary) hypertension: Secondary | ICD-10-CM

## 2022-05-21 ENCOUNTER — Other Ambulatory Visit: Payer: Self-pay

## 2022-05-21 MED FILL — Amlodipine Besylate Tab 5 MG (Base Equivalent): ORAL | 90 days supply | Qty: 90 | Fill #0 | Status: AC

## 2022-05-22 ENCOUNTER — Other Ambulatory Visit: Payer: Self-pay

## 2022-07-02 ENCOUNTER — Other Ambulatory Visit: Payer: Self-pay

## 2022-07-08 ENCOUNTER — Other Ambulatory Visit: Payer: Self-pay

## 2022-07-08 ENCOUNTER — Other Ambulatory Visit: Payer: Self-pay | Admitting: Family Medicine

## 2022-07-08 DIAGNOSIS — I10 Essential (primary) hypertension: Secondary | ICD-10-CM

## 2022-07-08 MED ORDER — SPIRONOLACTONE 50 MG PO TABS
50.0000 mg | ORAL_TABLET | Freq: Every day | ORAL | 1 refills | Status: AC
  Filled 2022-07-08: qty 90, 90d supply, fill #0
  Filled 2022-10-01: qty 90, 90d supply, fill #1

## 2022-07-09 ENCOUNTER — Other Ambulatory Visit: Payer: Self-pay

## 2022-07-09 MED FILL — Amlodipine Besylate Tab 5 MG (Base Equivalent): ORAL | 90 days supply | Qty: 90 | Fill #1 | Status: AC

## 2022-07-23 ENCOUNTER — Other Ambulatory Visit: Payer: Self-pay | Admitting: Family Medicine

## 2022-07-23 ENCOUNTER — Other Ambulatory Visit: Payer: Self-pay

## 2022-07-23 MED ORDER — WEGOVY 2.4 MG/0.75ML ~~LOC~~ SOAJ
2.4000 mg | SUBCUTANEOUS | 1 refills | Status: AC
  Filled 2022-07-23: qty 9, 84d supply, fill #0
  Filled 2022-08-27 – 2022-09-11 (×2): qty 3, 28d supply, fill #0

## 2022-08-27 ENCOUNTER — Other Ambulatory Visit: Payer: Self-pay

## 2022-09-11 ENCOUNTER — Other Ambulatory Visit: Payer: Self-pay

## 2022-10-01 ENCOUNTER — Other Ambulatory Visit: Payer: Self-pay

## 2022-10-01 ENCOUNTER — Other Ambulatory Visit: Payer: Self-pay | Admitting: Family Medicine

## 2022-10-01 DIAGNOSIS — I10 Essential (primary) hypertension: Secondary | ICD-10-CM

## 2022-10-03 ENCOUNTER — Other Ambulatory Visit: Payer: Self-pay

## 2022-10-03 MED ORDER — AMLODIPINE BESYLATE 5 MG PO TABS
5.0000 mg | ORAL_TABLET | Freq: Every day | ORAL | 1 refills | Status: AC
  Filled 2022-10-03: qty 90, 90d supply, fill #0
